# Patient Record
Sex: Male | Born: 1970 | Race: White | Hispanic: No | Marital: Married | Smoking: Never smoker
Health system: Southern US, Community
[De-identification: ages and names within clinical notes are randomized; demographics above are authoritative.]

## PROBLEM LIST (undated history)

## (undated) DIAGNOSIS — R519 Headache, unspecified: Secondary | ICD-10-CM

## (undated) DIAGNOSIS — M5136 Other intervertebral disc degeneration, lumbar region: Secondary | ICD-10-CM

## (undated) DIAGNOSIS — I10 Essential (primary) hypertension: Secondary | ICD-10-CM

## (undated) DIAGNOSIS — M51369 Other intervertebral disc degeneration, lumbar region without mention of lumbar back pain or lower extremity pain: Secondary | ICD-10-CM

## (undated) DIAGNOSIS — R51 Headache: Secondary | ICD-10-CM

## (undated) HISTORY — PX: TENDON REPAIR: SHX5111

## (undated) HISTORY — PX: HIP SURGERY: SHX245

## (undated) HISTORY — PX: VASECTOMY: SHX75

---

## 2005-04-14 ENCOUNTER — Emergency Department: Payer: Self-pay | Admitting: Emergency Medicine

## 2013-02-18 ENCOUNTER — Ambulatory Visit: Payer: Self-pay | Admitting: Family Medicine

## 2013-07-14 ENCOUNTER — Ambulatory Visit: Payer: Self-pay | Admitting: Family Medicine

## 2014-03-19 ENCOUNTER — Ambulatory Visit: Payer: Self-pay | Admitting: Unknown Physician Specialty

## 2014-11-23 ENCOUNTER — Ambulatory Visit: Payer: Self-pay | Admitting: Physician Assistant

## 2015-09-20 ENCOUNTER — Encounter: Payer: Self-pay | Admitting: Anesthesiology

## 2015-09-20 ENCOUNTER — Ambulatory Visit: Payer: 59 | Attending: Anesthesiology | Admitting: Anesthesiology

## 2015-09-20 ENCOUNTER — Other Ambulatory Visit: Payer: Self-pay | Admitting: Anesthesiology

## 2015-09-20 VITALS — BP 140/92 | HR 109 | Temp 98.5°F | Resp 16 | Ht 71.0 in | Wt 172.0 lb

## 2015-09-20 DIAGNOSIS — M5136 Other intervertebral disc degeneration, lumbar region: Secondary | ICD-10-CM | POA: Insufficient documentation

## 2015-09-20 DIAGNOSIS — M79606 Pain in leg, unspecified: Secondary | ICD-10-CM | POA: Diagnosis present

## 2015-09-20 DIAGNOSIS — Z9889 Other specified postprocedural states: Secondary | ICD-10-CM | POA: Insufficient documentation

## 2015-09-20 DIAGNOSIS — M25559 Pain in unspecified hip: Secondary | ICD-10-CM | POA: Diagnosis present

## 2015-09-20 DIAGNOSIS — M5442 Lumbago with sciatica, left side: Secondary | ICD-10-CM | POA: Diagnosis not present

## 2015-09-20 DIAGNOSIS — M5440 Lumbago with sciatica, unspecified side: Secondary | ICD-10-CM | POA: Insufficient documentation

## 2015-09-20 DIAGNOSIS — G8929 Other chronic pain: Secondary | ICD-10-CM | POA: Insufficient documentation

## 2015-09-20 DIAGNOSIS — M461 Sacroiliitis, not elsewhere classified: Secondary | ICD-10-CM

## 2015-09-20 DIAGNOSIS — M549 Dorsalgia, unspecified: Secondary | ICD-10-CM | POA: Diagnosis present

## 2015-09-20 DIAGNOSIS — M25552 Pain in left hip: Secondary | ICD-10-CM | POA: Diagnosis not present

## 2015-09-20 NOTE — Progress Notes (Signed)
Patient here to discuss trigger point injections to help with low back pain that radiates into L hip and L leg.  Reports scheduled surgery on March 15 for a Labrial repair and spur on hip bone which will be done orthoscopically.  Also reports bulging disc between L4 and L5.  Patient uses Tramadol for pain

## 2015-09-21 NOTE — Progress Notes (Signed)
Subjective:  Patient ID: [name] [name], male    DOB: [birth year]  Age: 45 y.o. MRN: [id number]  CC: Back Pain; Hip Pain; and Leg Pain   HPI [name] [name] presents for a new patient evaluation he has a long-standing history of low back pain it's been present for 2-1/2 years. The pain he describes initiated after a fall on rocks. It's gradually gotten worse and he has been evaluated and pain control Center's in the past. He has a maximum VAS score of 6 at best a 3 and averaging about a 4. The pain is not influence by time of day but worse after activity and aggravated by bending sitting standing and walking. TENS application and lying down seem to help his pain relief. He has associated complaints of muscle spasms and numbness and tingling affecting the left greater than right toes with calf cramping on the left side pain that wakes him up at night and this is described as aching deep and disabling. She's had a previous MRI EMG study an orthopedic evaluation. He is also had TENS unit application. At this point he is followed by Dr. Leonette [name] at [facility] for spinal surgery evaluation which was performed back in November 2016. He is also scheduled to have a labral tear in his left hip repaired in March of this year. The previous MRI of his back was performed in October last year with a hip MRI in December of last year. He also reports having had an EMG study that showed to be within normal limits.  History [name] [name] has no past medical history on file.   He has past surgical history that includes Tendon repair (Right) and Vasectomy.   His family history includes Hypertension in his father and mother.He reports that he has never smoked. He does not have any smokeless tobacco history on file. He reports that he does not use illicit drugs. His alcohol history is not on file.   ---------------------------------------------------------------------------------------------------------------------- History  reviewed. No pertinent past medical history.  Past Surgical History  Procedure Laterality Date  . Tendon repair Right     cut tendon repaired in R thumb  . Vasectomy      Family History  Problem Relation Age of Onset  . Hypertension Mother   . Hypertension Father     Social History  Substance Use Topics  . Smoking status: Never Smoker   . Smokeless tobacco: Not on file  . Alcohol Use: Not on file    ---------------------------------------------------------------------------------------------------------------------- Social History   Social History  . Marital Status: Married    Spouse Name: N/A  . Number of Children: N/A  . Years of Education: N/A   Social History Main Topics  . Smoking status: Never Smoker   . Smokeless tobacco: None  . Alcohol Use: None  . Drug Use: No  . Sexual Activity: Not Asked   Other Topics Concern  . None   Social History Narrative  . None      ----------------------------------------------------------------------------------------------------------------------  ROS Review of Systems  Review of vascular for hypertension otherwise negative Pulmonary negative Neurologic negative Psychological negative GI negative   Objective:  BP 140/92 mmHg  Pulse 109  Temp(Src) 98.5 F (36.9 C) (Oral)  Resp 16  Ht  (1.803 m)  Wt 172 lb (78.019 kg)  BMI 24.00 kg/m2  SpO2 100%  Physical Exam  Patient is alert oriented cooperative compliant Pupils equally round reactive to light with extraocular muscles intact Heart is regular rate and rhythm without  murmur or rub Lungs are clear also dictation with distant breath sounds no wheezing or rales Inspection low back reveals some paraspinous muscle tenderness worse on the left side. With the patient in the standing position he does have pain with left lateral rotation and extension at the low back greater than right lateral rotation. In the supine position he has a positive straight leg  raise at 30 left as right leg raise positive at 30 right both maneuvers radiate pain to the feet bilaterally. His strength in the lower extremities somewhat equivocal and I would rate this a 5 minus over 5 to dorsiflexion and plantar flexion at the feet. Sensation is intact to light touch    Assessment & Plan:   [name] [name] was seen today for back pain, hip pain and leg pain.  Diagnoses and all orders for this visit:  DDD (degenerative disc disease), lumbar  Bilateral low back pain with left-sided sciatica  Hip pain, chronic, left     ----------------------------------------------------------------------------------------------------------------------  Problem List Items Addressed This Visit    None    Visit Diagnoses    DDD (degenerative disc disease), lumbar    -  Primary    Relevant Medications    traMADol (ULTRAM) 50 MG tablet    meloxicam (MOBIC) 15 MG tablet    tiZANidine (ZANAFLEX) 4 MG capsule    Bilateral low back pain with left-sided sciatica        Relevant Medications    traMADol (ULTRAM) 50 MG tablet    meloxicam (MOBIC) 15 MG tablet    tiZANidine (ZANAFLEX) 4 MG capsule    Hip pain, chronic, left        Relevant Medications    traMADol (ULTRAM) 50 MG tablet    meloxicam (MOBIC) 15 MG tablet    tiZANidine (ZANAFLEX) 4 MG capsule    traZODone (DESYREL) 50 MG tablet       ----------------------------------------------------------------------------------------------------------------------  1. DDD (degenerative disc disease), lumbar We will plan on an epidural steroid injection which I think will be of benefit for the radiculitis that he's experiencing. This is left side greater than right and appears to be in the L5-S1 distribution. We'll plan on doing this most likely following his labral hip surgery. A repeat evaluation was agreed upon in approximately 3 months on his rehabilitation from this procedure in about 2 months.  2. Bilateral low back pain with  left-sided sciatica Same  3. Hip pain, chronic, left Continue follow-up with his orthopedic surgeon for surgical repair of a labral tear 4. He may be candidate for an SI injection on the left side with symptoms consistent with sacroiliitis and possibly facet genic characteristics for a facet block.    ----------------------------------------------------------------------------------------------------------------------  I am having Mr. Nier maintain his traMADol, amLODipine, lisinopril, meloxicam, alfuzosin, tiZANidine, traZODone, and sildenafil.   Meds ordered this encounter  Medications  . traMADol (ULTRAM) 50 MG tablet    Sig: Take 100 mg by mouth 2 (two) times daily.  Marland Kitchen amLODipine (NORVASC) 5 MG tablet    Sig: Take 5 mg by mouth daily.  Marland Kitchen lisinopril (PRINIVIL,ZESTRIL) 10 MG tablet    Sig: Take 10 mg by mouth daily.  . meloxicam (MOBIC) 15 MG tablet    Sig: Take 15 mg by mouth daily.  Marland Kitchen alfuzosin (UROXATRAL) 10 MG 24 hr tablet    Sig: Take 10 mg by mouth daily with breakfast.  . tiZANidine (ZANAFLEX) 4 MG capsule    Sig: Take 4 mg by mouth 1 day or 1  dose.  . traZODone (DESYREL) 50 MG tablet    Sig: Take 50 mg by mouth at bedtime.  . sildenafil (REVATIO) 20 MG tablet    Sig: Take 20 mg by mouth 1 day or 1 dose.       Follow-up: Return in about 4 months (around 01/18/2016) for procedure.    Yevette Edwards, MD

## 2015-09-29 LAB — TOXASSURE SELECT 13 (MW), URINE: PDF: 0

## 2015-10-14 LAB — SPECIMEN STATUS REPORT

## 2016-07-05 NOTE — Discharge Instructions (Signed)
[facility] °[facility] °ENDOSCOPIC SINUS SURGERY °[facility] ° °What is Functional Endoscopic Sinus Surgery? ° The Surgery involves making the natural openings of the sinuses larger by removing the bony partitions that separate the sinuses from the nasal cavity.  The natural sinus lining is preserved as much as possible to allow the sinuses to resume normal function after the surgery.  In some patients nasal polyps (excessively swollen lining of the sinuses) may be removed to relieve obstruction of the sinus openings.  The surgery is performed through the nose using lighted scopes, which eliminates the need for incisions on the face.  A septoplasty is a different procedure which is sometimes performed with sinus surgery.  It involves straightening the boy partition that separates the two sides of your nose.  A crooked or deviated septum may need repair if is obstructing the sinuses or nasal airflow.  Turbinate reduction is also often performed during sinus surgery.  The turbinates are bony proturberances from the side walls of the nose which swell and can obstruct the nose in patients with sinus and allergy problems.  Their size can be surgically reduced to help relieve nasal obstruction. ° °What Can Sinus Surgery Do For Me? ° Sinus surgery can reduce the frequency of sinus infections requiring antibiotic treatment.  This can provide improvement in nasal congestion, post-nasal drainage, facial pressure and nasal obstruction.  Surgery will NOT prevent you from ever having an infection again, so it usually only for patients who get infections 4 or more times yearly requiring antibiotics, or for infections that do not clear with antibiotics.  It will not cure nasal allergies, so patients with allergies may still require medication to treat their allergies after surgery. Surgery may improve headaches related to sinusitis, however, some people will continue to  require medication to control sinus headaches related to allergies.  Surgery will do nothing for other forms of headache (migraine, tension or cluster). ° °What Are the Risks of Endoscopic Sinus Surgery? ° Current techniques allow surgery to be performed safely with little risk, however, there are rare complications that patients should be aware of.  Because the sinuses are located around the eyes, there is risk of eye injury, including blindness, though again, this would be quite rare. This is usually a result of bleeding behind the eye during surgery, which puts the vision oat risk, though there are treatments to protect the vision and prevent permanent disrupted by surgery causing a leak of the spinal fluid that surrounds the brain.  More serious complications would include bleeding inside the brain cavity or damage to the brain.  Again, all of these complications are uncommon, and spinal fluid leaks can be safely managed surgically if they occur.  The most common complication of sinus surgery is bleeding from the nose, which may require packing or cauterization of the nose.  Continued sinus have polyps may experience recurrence of the polyps requiring revision surgery.  Alterations of sense of smell or injury to the tear ducts are also rare complications.  ° °What is the Surgery Like, and what is the Recovery? ° The Surgery usually takes a couple of hours to perform, and is usually performed under a general anesthetic (completely asleep).  Patients are usually discharged home after a couple of hours.  Sometimes during surgery it is necessary to pack the nose to control bleeding, and the packing is left in place for 24 - 48 hours, and removed by your surgeon.    If a septoplasty was performed during the procedure, there is often a splint placed which must be removed after 5-7 days.   °Discomfort: Pain is usually mild to moderate, and can be controlled by prescription pain medication or acetaminophen (Tylenol).   Aspirin, Ibuprofen (Advil, Motrin), or Naprosyn (Aleve) should be avoided, as they can cause increased bleeding.  Most patients feel sinus pressure like they have a bad head cold for several days.  Sleeping with your head elevated can help reduce swelling and facial pressure, as can ice packs over the face.  A humidifier may be helpful to keep the mucous and blood from drying in the nose.  ° °Diet: There are no specific diet restrictions, however, you should generally start with clear liquids and a light diet of bland foods because the anesthetic can cause some nausea.  Advance your diet depending on how your stomach feels.  Taking your pain medication with food will often help reduce stomach upset which pain medications can cause. ° °Nasal Saline Irrigation: It is important to remove blood clots and dried mucous from the nose as it is healing.  This is done by having you irrigate the nose at least 3 - 4 times daily with a salt water solution.  We recommend using NeilMed Sinus Rinse (available at the drug store).  Fill the squeeze bottle with the solution, bend over a sink, and insert the tip of the squeeze bottle into the nose ½ of an inch.  Point the tip of the squeeze bottle towards the inside corner of the eye on the same side your irrigating.  Squeeze the bottle and gently irrigate the nose.  If you bend forward as you do this, most of the fluid will flow back out of the nose, instead of down your throat.   The solution should be warm, near body temperature, when you irrigate.   Each time you irrigate, you should use a full squeeze bottle.  ° °Note that if you are instructed to use Nasal Steroid Sprays at any time after your surgery, irrigate with saline BEFORE using the steroid spray, so you do not wash it all out of the nose. °Another product, Nasal Saline Gel (such as AYR Nasal Saline Gel) can be applied in each nostril 3 - 4 times daily to moisture the nose and reduce scabbing or crusting. ° °Bleeding:   Bloody drainage from the nose can be expected for several days, and patients are instructed to irrigate their nose frequently with salt water to help remove mucous and blood clots.  The drainage may be dark red or brown, though some fresh blood may be seen intermittently, especially after irrigation.  Do not blow you nose, as bleeding may occur. If you must sneeze, keep your mouth open to allow air to escape through your mouth. ° °If heavy bleeding occurs: Irrigate the nose with saline to rinse out clots, then spray the nose 3 - 4 times with Afrin Nasal Decongestant Spray.  The spray will constrict the blood vessels to slow bleeding.  Pinch the lower half of your nose shut to apply pressure, and lay down with your head elevated.  Ice packs over the nose may help as well. If bleeding persists despite these measures, you should notify your doctor.  Do not use the Afrin routinely to control nasal congestion after surgery, as it can result in worsening congestion and may affect healing.  ° ° ° °Activity: Return to work varies among patients. Most patients will be   out of work at least 5 - 7 days to recover.  Patient may return to work after they are off of narcotic pain medication, and feeling well enough to perform the functions of their job.  Patients must avoid heavy lifting (over 10 pounds) or strenuous physical for 2 weeks after surgery, so your employer may need to assign you to light duty, or keep you out of work longer if light duty is not possible.  NOTE: you should not drive, operate dangerous machinery, do any mentally demanding tasks or make any important legal or financial decisions while on narcotic pain medication and recovering from the general anesthetic.  °  °Call Your Doctor Immediately if You Have Any of the Following: °1. Bleeding that you cannot control with the above measures °2. Loss of vision, double vision, bulging of the eye or black eyes. °3. Fever over 101 degrees °4. Neck stiffness with  severe headache, fever, nausea and change in mental state. °You are always encourage to call anytime with concerns, however, please call with requests for pain medication refills during office hours. ° °Office Endoscopy: During follow-up visits your doctor will remove any packing or splints that may have been placed and evaluate and clean your sinuses endoscopically.  Topical anesthetic will be used to make this as comfortable as possible, though you may want to take your pain medication prior to the visit.  How often this will need to be done varies from patient to patient.  After complete recovery from the surgery, you may need follow-up endoscopy from time to time, particularly if there is concern of recurrent infection or nasal polyps. ° °General Anesthesia, Adult, Care After °Refer to this sheet in the next few weeks. These instructions provide you with information on caring for yourself after your procedure. Your health care provider may also give you more specific instructions. Your treatment has been planned according to current medical practices, but problems sometimes occur. Call your health care provider if you have any problems or questions after your procedure. °WHAT TO EXPECT AFTER THE PROCEDURE °After the procedure, it is typical to experience: °· Sleepiness. °· Nausea and vomiting. °HOME CARE INSTRUCTIONS °· For the first 24 hours after general anesthesia: °¨ Have a responsible person with you. °¨ Do not drive a car. If you are alone, do not take public transportation. °¨ Do not drink alcohol. °¨ Do not take medicine that has not been prescribed by your health care provider. °¨ Do not sign important papers or make important decisions. °¨ You may resume a normal diet and activities as directed by your health care provider. °· Change bandages (dressings) as directed. °· If you have questions or problems that seem related to general anesthesia, call the hospital and ask for the anesthetist or  anesthesiologist on call. °SEEK MEDICAL CARE IF: °· You have nausea and vomiting that continue the day after anesthesia. °· You develop a rash. °SEEK IMMEDIATE MEDICAL CARE IF:  °· You have difficulty breathing. °· You have chest pain. °· You have any allergic problems. °  °This information is not intended to replace advice given to you by your health care provider. Make sure you discuss any questions you have with your health care provider. °  °Document Released: 11/26/2000 Document Revised: 09/10/2014 Document Reviewed: 12/19/2011 °Elsevier Interactive Patient Education ©2016 [employer] ° °

## 2016-07-12 ENCOUNTER — Ambulatory Visit: Payer: 59 | Admitting: Anesthesiology

## 2016-07-12 ENCOUNTER — Ambulatory Visit
Admission: RE | Admit: 2016-07-12 | Discharge: 2016-07-12 | Disposition: A | Discharge status: Home / Self Care | Payer: 59 | Source: Ambulatory Visit | Attending: Otolaryngology | Admitting: Otolaryngology

## 2016-07-12 ENCOUNTER — Encounter
Admission: RE | Disposition: A | Discharge status: Home / Self Care | Payer: Self-pay | Source: Ambulatory Visit | Attending: Otolaryngology

## 2016-07-12 ENCOUNTER — Ambulatory Visit: Admit: 2016-07-12 | Payer: 59 | Admitting: Otolaryngology

## 2016-07-12 ENCOUNTER — Observation Stay
Admission: AD | Admit: 2016-07-12 | Discharge: 2016-07-13 | Disposition: A | Discharge status: Home / Self Care | Payer: 59 | Source: Ambulatory Visit | Attending: Otolaryngology | Admitting: Otolaryngology

## 2016-07-12 ENCOUNTER — Inpatient Hospital Stay: Admission: AD | Admit: 2016-07-12 | Payer: Self-pay | Source: Intra-hospital | Admitting: Psychiatry

## 2016-07-12 DIAGNOSIS — I10 Essential (primary) hypertension: Secondary | ICD-10-CM | POA: Insufficient documentation

## 2016-07-12 DIAGNOSIS — J343 Hypertrophy of nasal turbinates: Secondary | ICD-10-CM | POA: Diagnosis not present

## 2016-07-12 DIAGNOSIS — Z91018 Allergy to other foods: Secondary | ICD-10-CM | POA: Diagnosis not present

## 2016-07-12 DIAGNOSIS — Z9889 Other specified postprocedural states: Secondary | ICD-10-CM | POA: Insufficient documentation

## 2016-07-12 DIAGNOSIS — R040 Epistaxis: Secondary | ICD-10-CM | POA: Diagnosis not present

## 2016-07-12 DIAGNOSIS — J342 Deviated nasal septum: Secondary | ICD-10-CM | POA: Diagnosis not present

## 2016-07-12 DIAGNOSIS — M1990 Unspecified osteoarthritis, unspecified site: Secondary | ICD-10-CM | POA: Insufficient documentation

## 2016-07-12 HISTORY — DX: Other intervertebral disc degeneration, lumbar region: M51.36

## 2016-07-12 HISTORY — DX: Essential (primary) hypertension: I10

## 2016-07-12 HISTORY — PX: TURBINATE REDUCTION: SHX6157

## 2016-07-12 HISTORY — PX: ENDOSCOPIC CONCHA BULLOSA RESECTION: SHX6395

## 2016-07-12 HISTORY — PX: NASAL ENDOSCOPY WITH EPISTAXIS CONTROL: SHX5664

## 2016-07-12 HISTORY — DX: Headache: R51

## 2016-07-12 HISTORY — DX: Other intervertebral disc degeneration, lumbar region without mention of lumbar back pain or lower extremity pain: M51.369

## 2016-07-12 HISTORY — DX: Headache, unspecified: R51.9

## 2016-07-12 HISTORY — PX: SEPTOPLASTY: SHX2393

## 2016-07-12 SURGERY — SEPTOPLASTY, NOSE
Anesthesia: General | Laterality: Right | Wound class: Clean Contaminated

## 2016-07-12 SURGERY — CONTROL OF EPISTAXIS, ENDOSCOPIC
Anesthesia: General | Laterality: Bilateral | Wound class: Clean Contaminated

## 2016-07-12 MED ORDER — OXYCODONE HCL 5 MG PO TABS: 5.0000 mg | ORAL_TABLET | Freq: Once | ORAL | Status: DC

## 2016-07-12 MED ORDER — HYDROMORPHONE HCL 1 MG/ML IJ SOLN
1.0000 mg | INTRAMUSCULAR | Status: DC | PRN
  Administered 2016-07-13: 1 mg via INTRAVENOUS
  Filled 2016-07-12: qty 1

## 2016-07-12 MED ORDER — OXYMETAZOLINE HCL 0.05 % NA SOLN
2.0000 | Freq: Once | NASAL | Status: AC
  Administered 2016-07-12: 2 via NASAL

## 2016-07-12 MED ORDER — ONDANSETRON HCL 4 MG/2ML IJ SOLN: 4.0000 mg | Freq: Four times a day (QID) | INTRAMUSCULAR | Status: DC | PRN

## 2016-07-12 MED ORDER — TRAZODONE HCL 50 MG PO TABS
50.0000 mg | ORAL_TABLET | Freq: Every day | ORAL | Status: DC
  Administered 2016-07-12: 50 mg via ORAL
  Filled 2016-07-12: qty 1

## 2016-07-12 MED ORDER — LIDOCAINE HCL 1 % IJ SOLN: INTRAMUSCULAR | Status: DC | PRN

## 2016-07-12 MED ORDER — LACTATED RINGERS IV SOLN
INTRAVENOUS | Status: DC | PRN
  Administered 2016-07-12: 16:00:00 via INTRAVENOUS

## 2016-07-12 MED ORDER — DEXTROSE-NACL 5-0.45 % IV SOLN
INTRAVENOUS | Status: DC
  Administered 2016-07-12 – 2016-07-13 (×2): via INTRAVENOUS

## 2016-07-12 MED ORDER — DEXAMETHASONE SODIUM PHOSPHATE 4 MG/ML IJ SOLN
INTRAMUSCULAR | Status: DC | PRN
  Administered 2016-07-12: 10 mg via INTRAVENOUS

## 2016-07-12 MED ORDER — DEXTROSE-NACL 5-0.45 % IV SOLN: INTRAVENOUS | Status: DC

## 2016-07-12 MED ORDER — HYDRALAZINE HCL 20 MG/ML IJ SOLN
10.0000 mg | Freq: Once | INTRAMUSCULAR | Status: AC
  Administered 2016-07-12: 10 mg via INTRAVENOUS

## 2016-07-12 MED ORDER — SUCCINYLCHOLINE CHLORIDE 20 MG/ML IJ SOLN
INTRAMUSCULAR | Status: DC | PRN
  Administered 2016-07-12: 100 mg via INTRAVENOUS

## 2016-07-12 MED ORDER — PROMETHAZINE HCL 25 MG/ML IJ SOLN: 6.2500 mg | INTRAMUSCULAR | Status: DC | PRN

## 2016-07-12 MED ORDER — PHENYLEPHRINE HCL 0.5 % NA SOLN
NASAL | Status: DC | PRN
  Administered 2016-07-12: 30 mL via TOPICAL

## 2016-07-12 MED ORDER — DEXTROSE 5 % IV SOLN
2000.0000 mg | Freq: Once | INTRAVENOUS | Status: AC
  Administered 2016-07-12: 2000 mg via INTRAVENOUS

## 2016-07-12 MED ORDER — ONDANSETRON HCL 4 MG/2ML IJ SOLN
INTRAMUSCULAR | Status: DC | PRN
  Administered 2016-07-12: 4 mg via INTRAVENOUS

## 2016-07-12 MED ORDER — PROPOFOL 10 MG/ML IV BOLUS
INTRAVENOUS | Status: DC | PRN
  Administered 2016-07-12: 150 mg via INTRAVENOUS

## 2016-07-12 MED ORDER — LISINOPRIL 10 MG PO TABS
10.0000 mg | ORAL_TABLET | Freq: Every day | ORAL | Status: DC
  Administered 2016-07-13: 10 mg via ORAL
  Filled 2016-07-12 (×2): qty 1

## 2016-07-12 MED ORDER — ACETAMINOPHEN 325 MG PO TABS: 650.0000 mg | ORAL_TABLET | Freq: Four times a day (QID) | ORAL | Status: DC | PRN

## 2016-07-12 MED ORDER — BACITRACIN ZINC 500 UNIT/GM EX OINT
TOPICAL_OINTMENT | CUTANEOUS | Status: DC | PRN
  Administered 2016-07-12: 2 via TOPICAL

## 2016-07-12 MED ORDER — FENTANYL CITRATE (PF) 100 MCG/2ML IJ SOLN
INTRAMUSCULAR | Status: DC | PRN
  Administered 2016-07-12: 100 ug via INTRAVENOUS

## 2016-07-12 MED ORDER — OXYCODONE HCL 5 MG/5ML PO SOLN: 5.0000 mg | Freq: Once | ORAL | Status: AC | PRN

## 2016-07-12 MED ORDER — OXYCODONE HCL 5 MG PO TABS
5.0000 mg | ORAL_TABLET | Freq: Once | ORAL | Status: AC | PRN
  Administered 2016-07-12: 5 mg via ORAL

## 2016-07-12 MED ORDER — ALFUZOSIN HCL ER 10 MG PO TB24
10.0000 mg | ORAL_TABLET | Freq: Every day | ORAL | Status: DC
  Administered 2016-07-12: 10 mg via ORAL
  Filled 2016-07-12 (×2): qty 1

## 2016-07-12 MED ORDER — SCOPOLAMINE 1 MG/3DAYS TD PT72
1.0000 | MEDICATED_PATCH | TRANSDERMAL | Status: DC
  Administered 2016-07-12: 1.5 mg via TRANSDERMAL

## 2016-07-12 MED ORDER — LACTATED RINGERS IV SOLN
INTRAVENOUS | Status: DC
  Administered 2016-07-12: 12:00:00 via INTRAVENOUS

## 2016-07-12 MED ORDER — FLEET ENEMA 7-19 GM/118ML RE ENEM: 1.0000 | ENEMA | Freq: Once | RECTAL | Status: DC | PRN

## 2016-07-12 MED ORDER — ACETAMINOPHEN 325 MG PO TABS
975.0000 mg | ORAL_TABLET | Freq: Once | ORAL | Status: AC
  Administered 2016-07-12: 975 mg via ORAL

## 2016-07-12 MED ORDER — LIDOCAINE HCL (CARDIAC) 20 MG/ML IV SOLN
INTRAVENOUS | Status: DC | PRN
  Administered 2016-07-12: 50 mg via INTRAVENOUS

## 2016-07-12 MED ORDER — BISACODYL 5 MG PO TBEC: 5.0000 mg | DELAYED_RELEASE_TABLET | Freq: Every day | ORAL | Status: DC | PRN

## 2016-07-12 MED ORDER — AMLODIPINE BESYLATE 5 MG PO TABS
5.0000 mg | ORAL_TABLET | Freq: Every day | ORAL | Status: DC
  Administered 2016-07-13: 5 mg via ORAL
  Filled 2016-07-12 (×2): qty 1

## 2016-07-12 MED ORDER — MEPERIDINE HCL 25 MG/ML IJ SOLN: 6.2500 mg | INTRAMUSCULAR | Status: DC | PRN

## 2016-07-12 MED ORDER — ACETAMINOPHEN 650 MG RE SUPP: 650.0000 mg | Freq: Four times a day (QID) | RECTAL | Status: DC | PRN

## 2016-07-12 MED ORDER — LIDOCAINE-EPINEPHRINE 1 %-1:100000 IJ SOLN
INTRAMUSCULAR | Status: DC | PRN
  Administered 2016-07-12: 8 mL

## 2016-07-12 MED ORDER — OXYMETAZOLINE HCL 0.05 % NA SOLN
2.0000 | NASAL | Status: AC | PRN
  Administered 2016-07-12 (×2): 2 via NASAL

## 2016-07-12 MED ORDER — ONDANSETRON 4 MG PO TBDP: 4.0000 mg | ORAL_TABLET | Freq: Four times a day (QID) | ORAL | Status: DC | PRN

## 2016-07-12 MED ORDER — MIDAZOLAM HCL 5 MG/5ML IJ SOLN
INTRAMUSCULAR | Status: DC | PRN
  Administered 2016-07-12: 2 mg via INTRAVENOUS

## 2016-07-12 MED ORDER — MAGNESIUM HYDROXIDE 400 MG/5ML PO SUSP: 30.0000 mL | Freq: Every day | ORAL | Status: DC | PRN

## 2016-07-12 MED ORDER — FENTANYL CITRATE (PF) 100 MCG/2ML IJ SOLN
25.0000 ug | INTRAMUSCULAR | Status: DC | PRN
  Administered 2016-07-12: 50 ug via INTRAVENOUS
  Administered 2016-07-12 (×2): 25 ug via INTRAVENOUS

## 2016-07-12 MED ORDER — SILDENAFIL CITRATE 20 MG PO TABS
20.0000 mg | ORAL_TABLET | Freq: Every day | ORAL | Status: DC
  Filled 2016-07-12 (×2): qty 1

## 2016-07-12 MED ORDER — HYDROCODONE-ACETAMINOPHEN 5-325 MG PO TABS
1.0000 | ORAL_TABLET | ORAL | Status: DC | PRN
  Administered 2016-07-12 – 2016-07-13 (×3): 1 via ORAL
  Administered 2016-07-13: 2 via ORAL
  Filled 2016-07-12 (×2): qty 1
  Filled 2016-07-12: qty 2
  Filled 2016-07-12: qty 1

## 2016-07-12 MED ORDER — METOPROLOL TARTRATE 5 MG/5ML IV SOLN
5.0000 mg | Freq: Once | INTRAVENOUS | Status: AC
  Administered 2016-07-12: 5 mg via INTRAVENOUS

## 2016-07-12 MED ORDER — ACETAMINOPHEN 10 MG/ML IV SOLN
1000.0000 mg | Freq: Once | INTRAVENOUS | Status: AC
  Administered 2016-07-12: 1000 mg via INTRAVENOUS

## 2016-07-12 MED ORDER — HYDROMORPHONE HCL 1 MG/ML IJ SOLN: 0.2500 mg | INTRAMUSCULAR | Status: DC | PRN

## 2016-07-12 MED ORDER — PROPOFOL 10 MG/ML IV BOLUS
INTRAVENOUS | Status: DC | PRN
  Administered 2016-07-12: 200 mg via INTRAVENOUS

## 2016-07-12 MED ORDER — HYDROMORPHONE HCL 1 MG/ML IJ SOLN
0.2500 mg | INTRAMUSCULAR | Status: DC | PRN
  Administered 2016-07-12: 0.5 mg via INTRAVENOUS
  Administered 2016-07-12: 0.4 mg via INTRAVENOUS
  Administered 2016-07-12: 0.5 mg via INTRAVENOUS
  Administered 2016-07-12: 0.4 mg via INTRAVENOUS
  Administered 2016-07-12: 0.2 mg via INTRAVENOUS

## 2016-07-12 SURGICAL SUPPLY — 33 items
BALLOON SINUPLASTY SYSTEM (BALLOONS) IMPLANT
CANISTER SUCT 1200ML W/VALVE (MISCELLANEOUS) ×5 IMPLANT
CATH IV 18X1 1/4 SAFELET (CATHETERS) ×5 IMPLANT
COAGULATOR SUCT 8FR VV (MISCELLANEOUS) ×5 IMPLANT
DEVICE INFLATION SEID (MISCELLANEOUS) IMPLANT
DRAPE HEAD BAR (DRAPES) ×5 IMPLANT
GLOVE PI ULTRA LF STRL 7.5 (GLOVE) ×6 IMPLANT
GLOVE PI ULTRA NON LATEX 7.5 (GLOVE) ×4
IV CATH 18X1 1/4 SAFELET (CATHETERS) ×3
IV NS 500ML (IV SOLUTION) ×2
IV NS 500ML BAXH (IV SOLUTION) ×3 IMPLANT
KIT ROOM TURNOVER OR (KITS) ×5 IMPLANT
NEEDLE ANESTHESIA  27G X 3.5 (NEEDLE) ×2
NEEDLE ANESTHESIA 27G X 3.5 (NEEDLE) ×3 IMPLANT
NS IRRIG 500ML POUR BTL (IV SOLUTION) ×5 IMPLANT
PACK DRAPE NASAL/ENT (PACKS) ×5 IMPLANT
PACKING NASAL EPIS 4X2.4 XEROG (MISCELLANEOUS) ×5 IMPLANT
PAD GROUND ADULT SPLIT (MISCELLANEOUS) ×5 IMPLANT
PATTIES SURGICAL .5 X3 (DISPOSABLE) ×5 IMPLANT
SHAVER DIEGO BLD STD TYPE A (BLADE) IMPLANT
SOL ANTI-FOG 6CC FOG-OUT (MISCELLANEOUS) ×3 IMPLANT
SOL FOG-OUT ANTI-FOG 6CC (MISCELLANEOUS) ×2
SPLINT NASAL SEPTAL BLV .50 ST (MISCELLANEOUS) ×5 IMPLANT
STRAP BODY AND KNEE 60X3 (MISCELLANEOUS) ×10 IMPLANT
SUT CHROMIC 3-0 (SUTURE)
SUT CHROMIC 3-0 KS 27XMFL CR (SUTURE)
SUT ETHILON 3-0 KS 30 BLK (SUTURE) ×5 IMPLANT
SUT PLAIN GUT 4-0 (SUTURE) ×5 IMPLANT
SUTURE CHRMC 3-0 KS 27XMFL CR (SUTURE) IMPLANT
SYR 3ML LL SCALE MARK (SYRINGE) ×5 IMPLANT
TOWEL OR 17X26 4PK STRL BLUE (TOWEL DISPOSABLE) ×5 IMPLANT
TUBING DECLOG (TUBING) IMPLANT
WATER STERILE IRR 250ML POUR (IV SOLUTION) IMPLANT

## 2016-07-12 SURGICAL SUPPLY — 19 items
BASIN GRAD PLASTIC 32OZ STRL (MISCELLANEOUS) IMPLANT
CANISTER SUCT 1200ML W/VALVE (MISCELLANEOUS) ×3 IMPLANT
COAG SUCT 10F 3.5MM HAND CTRL (MISCELLANEOUS) ×3 IMPLANT
COVER TABLE BACK 60X90 (DRAPES) ×3 IMPLANT
CUP MEDICINE 2OZ PLAST GRAD ST (MISCELLANEOUS) IMPLANT
GAUZE SPONGE 4X4 12PLY STRL (GAUZE/BANDAGES/DRESSINGS) ×6 IMPLANT
GLOVE PI ULTRA LF STRL 7.5 (GLOVE) ×1 IMPLANT
GLOVE PI ULTRA NON LATEX 7.5 (GLOVE) ×2
KIT ROOM TURNOVER OR (KITS) ×3 IMPLANT
NEEDLE HYPO 25GX1X1/2 BEV (NEEDLE) ×3 IMPLANT
PACK DRAPE NASAL/ENT (PACKS) ×3 IMPLANT
PACKING EPISTAXIS NASAL BLU LR (MISCELLANEOUS) ×15 IMPLANT
PACKING NASAL EPIS 4X2.4 XEROG (MISCELLANEOUS) ×3 IMPLANT
PAD GROUND ADULT SPLIT (MISCELLANEOUS) ×3 IMPLANT
PATTIES SURGICAL .5 X3 (DISPOSABLE) ×3 IMPLANT
SYRINGE 10CC LL (SYRINGE) ×3 IMPLANT
TUBING CONNECTING 10 (TUBING) ×2 IMPLANT
TUBING CONNECTING 10' (TUBING) ×1
WAND COBLATOR ENT REFLEX ULT45 (MISCELLANEOUS) IMPLANT

## 2016-07-12 NOTE — Anesthesia Postprocedure Evaluation (Signed)
Anesthesia Post Note  Patient: [name] [name]  Procedure(s) Performed: Procedure(s) (LRB): NASAL ENDOSCOPY WITH EPISTAXIS CONTROL (Bilateral)  Patient location during evaluation: PACU Anesthesia Type: General Level of consciousness: awake and alert and oriented Pain management: pain level controlled Vital Signs Assessment: post-procedure vital signs reviewed and stable Respiratory status: spontaneous breathing and nonlabored ventilation Cardiovascular status: stable Postop Assessment: no signs of nausea or vomiting and adequate PO intake Anesthetic complications: no    Harolyn RutherfordJoshua Treyton Slimp

## 2016-07-12 NOTE — Progress Notes (Addendum)
1440 Called & informed Dr. Elenore RotaJuengel that both nares began bleeding again, received T.O. for afrin 2 sprays each nare x 2 doses.   1500 Both nares continue to bleed despite 2 doses of afrin, Dr. Elenore RotaJuengel called & informed, MD verbalizes he will come to bedside & see patient.

## 2016-07-12 NOTE — Op Note (Signed)
07/12/2016  5:53 PM    [name] [name], [name] [name]  [id number]   Pre-Op Dx:  Uncontrolled epistaxis postop septal and turbinate surgery  Post-op Dx: Uncontrolled epistaxis, postop septal and turbinate surgery, apparently from left anterior ethmoid  Proc: Endoscopic evaluation of the nose, bilateral posterior nasal packing   Surg:  Serge Main H  Anes:  GOT  EBL:  300 mL  Comp:  Bleeding postoperative septal and turbinate surgery  Findings:  The bleeding apparently was coming from the left anterior ethmoid artery between the septum and left middle turbinate. There is no bleeding on the right side.  Procedure: The patient was brought to the operating suite and was given general anesthesia again through oral endotracheal intubation. He was bleeding actively from his left side. His blood pressure still was elevated but eventually after anesthesia it seemed like it settle down. He had packing placed in his nose both sides in the recover room but continued to bleed through the packing on the left side. The pressure was running 160-170/90-100 in recovery. He did not seem to respond to indications to control blood pressure, and after about an hour bleeding on and off was felt he needed to go back to the operating room to be reevaluated.  Once patient was asleep his blood pressure was lower and there is no active bleeding vacancy at the time I felt he needed to have this evaluated further. I remove the left nasal spine to the left right one and. Septum was Stilson the midline with the nasal splints in good position. There is some bleeding that was pooling in the back of the nose and nose Suctioning about see where the bleeding was coming from seem like he had some ooze coming from the septum and a little bit coming from along the inferior turbinate but there is no significant bleeding there all this time. After watched for a while I decided to replace the sponge in the left side. I placed a posterior pack in the  left side and weighted. For the first 2 minutes there is no bleeding whatsoever then it welled up through the packing and just started running again fresh red blood. Remove the packing on both sides the nose to reevaluate the area.  There is no bleeding on the right side at all. The xerogel that was overlying the right middle turbinate was still intact and there were no clots anywhere on the right side. Nasal splints were still in position. The side showed little bit of bleeding coming from the septum but I could see blood pooling in the back of the nose and finally track this it was coming medial to the middle turbinate and seen be coming from the anterior ethmoid artery. Then took some xerogel and stuffed it up medial to the middle turbinate and packed this area. Once I packed it in the area of the leading seemed to stop and it was no longer welling up in the posterior nasopharynx. I watch this for good 5 minutes make sure there is no further bleeding or bleeding coming from any other sites. After about 10 minutes decided to go ahead and replace the posterior packs in both sides to help keep pressure on the septum and in these areas. I placed bilateral posterior packs using sponge Prax on both sides to the full length of the nose into the nasopharynx. These went in without difficulty and were soaked with some phenylephrine. Further bleeding for through the sponges and sponges did not have any blood  staining. Further bleeding coming down the posterior pharynx.  A nasogastric tube was then placed in a somewhat to suction out stomach. Old blood was suctioned out and this amounted to approximately 350 mL of old blood and gastric contents. There still was no further evidence of bleeding coming from the nose.  The patient was awakened and taken to the recovery room in satisfactory condition. He was watch there and there is no bleeding coming from the nose. The sponges and the nose had no blood staining. His  posterior pharynx was cleared he was not swallowing any blood.    Dispo:   Patient will be discharged from surgery Center but taken by ambulance to [facility] to be observed overnight because of the amount of blood loss he had. Name to stay at rest and deep his blood pressure under good control to make sure he does not have further bleeding.  Plan:  If his nasal packing remains dry we'll plan to discharge him home tomorrow at noon time area did leave the packing in through the weekend and plan to remove this on Monday.  Mylan Schwarz H  07/12/2016 5:53 PM

## 2016-07-12 NOTE — Addendum Note (Signed)
Addendum  created 07/12/16 1520 by Harolyn RutherfordJoshua Kaede Clendenen, DO   Order list changed, Order sets accessed

## 2016-07-12 NOTE — Addendum Note (Signed)
Addendum  created 07/12/16 1558 by Harolyn RutherfordJoshua Zeplin Aleshire, DO   Anesthesia Event edited

## 2016-07-12 NOTE — Transfer of Care (Signed)
Immediate Anesthesia Transfer of Care Note  Patient: [name] [name]  Procedure(s) Performed: Procedure(s): SEPTOPLASTY (N/A) INFERIOR TURBINATE REDUCTION (Left) ENDOSCOPIC CONCHA BULLOSA RESECTION (Right)  Patient Location: PACU  Anesthesia Type: General  Level of Consciousness: awake, alert  and patient cooperative  Airway and Oxygen Therapy: Patient Spontanous Breathing and Patient connected to supplemental oxygen  Post-op Assessment: Post-op Vital signs reviewed, Patient's Cardiovascular Status Stable, Respiratory Function Stable, Patent Airway and No signs of Nausea or vomiting  Post-op Vital Signs: Reviewed and stable  Complications: No apparent anesthesia complications

## 2016-07-12 NOTE — Anesthesia Postprocedure Evaluation (Signed)
Anesthesia Post Note  Patient: Rocio Shawn Web  Procedure(s) Performed: Procedure(s) (LRB): SEPTOPLASTY (N/A) INFERIOR TURBINATE REDUCTION (Left) ENDOSCOPIC CONCHA BULLOSA RESECTION (Right)  Patient location during evaluation: PACU Anesthesia Type: General Level of consciousness: awake and alert and oriented Pain management: pain level controlled Vital Signs Assessment: post-procedure vital signs reviewed and stable Respiratory status: spontaneous breathing and nonlabored ventilation Cardiovascular status: stable Postop Assessment: no signs of nausea or vomiting and adequate PO intake Anesthetic complications: no    Harolyn RutherfordJoshua Dontez Hauss

## 2016-07-12 NOTE — Progress Notes (Signed)
Report called to Leotis ShamesLauren, 2C RN at [facility]. Pt remains stable at this time. No active bleeding & VSS. Will continue to monitor.

## 2016-07-12 NOTE — Addendum Note (Signed)
Addendum  created 07/12/16 1754 by Harolyn RutherfordJoshua Donelle Hise, DO   Order list changed

## 2016-07-12 NOTE — Op Note (Signed)
07/12/2016  2:08 PM  [id number]   Pre-Op Dx:  Deviated Nasal Septum, Hypertrophic left Inferior Turbinate, conchal bullosa right middle turbinate  Post-op Dx: Same  Proc: Nasal Septoplasty, endoscopic reduction of right middle turbinate conchal bullosa, Partial Reduction left Inferior Turbinate   Surg:  Anieya Helman H  Anes:  GOT  EBL:  50 mL  Comp:  None  Findings: Very large spur inferiorly on the right side pushing into the right inferior turbinate. This was from the maxillary crest and vomer. The left inferior turbinate was overgrown the right middle turbinate had a large conchal bullosa.  Procedure: With the patient in a comfortable supine position,  general orotracheal anesthesia was induced without difficulty.     The patient received preoperative Afrin spray for topical decongestion and vasoconstriction.  Intravenous prophylactic antibiotics were administered.  At an appropriate level, the patient was placed in a semi-sitting position.  Nasal vibrissae were trimmed.   1% Xylocaine with 1:100,000 epinephrine, 8 cc's, was infiltrated into the anterior floor of the nose, into the nasal spine region, into the membranous columella, and finally into the submucoperichondrial plane of the septum on both sides.  Several minutes were allowed for this to take effect.  Cottoniod pledgetts soaked in Afrin and 4% Xylocaine were placed into both nasal cavities and left while the patient was prepped and draped in the standard fashion.  The materials were removed from the nose and observed to be intact and correct in number.  The nose was inspected with a headlight and a 0 scope with the findings as described above.  A left Killian incision was sharply executed and carried down to the quadrangular cartilage. The mucoperichondrium was elelvated along the quadrangular plate back to the bony-cartilaginous junction. The mucoperiostium was then elevated along the ethmoid plate and the vomer. The  boney-catilaginous junction was then split with a freer elevator and the mucoperiosteum was elevated on the opposite side. The mucoperiosteum was then elevated along the maxillary crest as needed to expose the crooked bone of the crest.  Boney spurs of the vomer and maxillary crest were removed with Lenoria Chimeakahashi forceps. I had to use a chisel to free up the maxillary crest the right anterior that was buckled into the right nasal passage.  The cartilaginous plate was trimmed along its posterior and inferior borders of about 2 mm of cartilage to free it up inferiorly. Some of the deviated ethmoid plate was then fractured and removed with Takahashi forceps to free up the posterior border of the quadrangular plate and allow it to swing back to the midline. The mucosal flaps were placed back into their anatomic position to allow visualization of the airways. The septum now sat in the midline with an improved airway. There was a tear of the mucosa inferiorly where the large spur and stuck out on the right side. This was repaired while suturing the septum.  A 3-0 Chromic suture on a Keith needle in used to anchor the inferior septum at the nasal spine with a through and through suture. The mucosal flaps are then sutured together using a through and through whip stitch of 4-0 Plain Gut with a mini-Keith needle. This was used to close the Run[place]illian incision as well.   The inferior turbinates were then inspected. An incision was created along the inferior aspect of the left inferior turbinate with removal of some of the inferior soft tissue and bone. Electrocautery was used to control bleeding in the area. The remaining turbinate was then  outfractured to open up the airway further. There was no significant bleeding noted. The right turbinate was indented from the previous spur. No trimming was necessary here.  The 0 scope was used to visualize the middle turbinate. The right side was enlarged and had a conchal bullosa  filling most of the middle turbinate. An incision was created along its anterior and inferior border to open up the conchal bullosa. The lateral wall this was removed and left open now to the airway. There was more room in the middle meatus. Electrocautery was used along its cut edge to control bleeding. Xerogel was then placed along the trimmed edge and in the middle meatus.  The airways were then visualized and showed open passageways on both sides that were significantly improved compared to before surgery. There was no signifcant bleeding. Nasal splints were applied to both sides of the septum using Xomed 0.335mm regular sized splints that were trimmed, and then held in position with a 3-0 Nylon through and through suture.  The patient was turned back over to anesthesia, and awakened, extubated, and taken to the PACU in satisfactory condition.  Dispo:   PACU to home  Plan: Ice, elevation, narcotic analgesia, steroid taper, and prophylactic antibiotics for the duration of indwelling nasal foreign bodies.  We will reevaluate the patient in the office in 6 days and remove the septal splints.  Return to work in 10 days, strenuous activities in two weeks.   Donyea Gafford H 07/12/2016 2:08 PM

## 2016-07-12 NOTE — Anesthesia Procedure Notes (Signed)
Procedure Name: Intubation Date/Time: 07/12/2016 1:03 PM Performed by: Andee PolesBUSH, Embry Manrique Pre-anesthesia Checklist: Patient identified, Emergency Drugs available, Suction available, Patient being monitored and Timeout performed Patient Re-evaluated:Patient Re-evaluated prior to inductionOxygen Delivery Method: Circle system utilized Preoxygenation: Pre-oxygenation with 100% oxygen Intubation Type: IV induction Ventilation: Mask ventilation without difficulty Grade View: Grade I Tube type: Oral Rae Tube size: 7.5 mm Number of attempts: 1 Placement Confirmation: ETT inserted through vocal cords under direct vision,  positive ETCO2 and breath sounds checked- equal and bilateral Tube secured with: Tape Dental Injury: Teeth and Oropharynx as per pre-operative assessment

## 2016-07-12 NOTE — H&P (Signed)
[name] [name],  [name] 45 y.o., male [id number]     Chief Complaint: Uncontrolled epistaxis, post septoplasty and turbinate reduction  HPI: The patient is a 45 year old white male who had plasty and trimming of his left inferior turbinate and endoscopic reduction of his right middle concha bullosa [facility] this afternoon. His surgery was uneventful and he was in the recovery room not have any problems and was a sniffing very hard. He sniffed third time and suddenly had acute onset of bleeding from his left nostril. Pressure was held and he was watched for about 20 minutes and it seemed like the bleeding was stopping. It started back up again so about 30 minutes later I placed bilateral sponge packs in the nostrils. He seemed to slow down but then continued to have bleeding in his left nostril coming around the packing. After watching him for a total of about an hour and 15 minutes without any success in stopping the bleeding he was taken back to the operating room to evaluate the nose and look for the source of bleeding. The pack was removed when he was found to have bleeding coming from the anterior ethmoid area on the left side, between the middle turbinate and the superior septum. Xerogel was stuffed into the anterior cleft and placed pressure in this area and this seemed to stop bleeding. Nasal packing was placed on both sides with sponge packs from the front of the nose to the nasopharynx. He's had no further bleeding since that time.  He had 300 mL of blood loss at time of surgery and 350 mL of old blood was suctioned from his stomach. He probably had a total of 1 L of blood loss in the postoperative period. It was felt that he would be best to be observed overnight and check his blood counts and make sure that he was at rest in stable and did not have any further bleeding before he was discharged home. He was transferred by ambulance to the hospital for observation overnight. He does not  have any further bleeding right now and will be evaluated again at noon tomorrow and discharged home if no further problems  PMH: Past Medical History:  Diagnosis Date  . Degenerative disc disease, lumbar   . Headache    sinus  . Hypertension     Surg Hx: Past Surgical History:  Procedure Laterality Date  . HIP SURGERY     laprascopic   . TENDON REPAIR Right    cut tendon repaired in R thumb  . VASECTOMY      FHx:   Family History  Problem Relation Age of Onset  . Hypertension Mother   . Hypertension Father    SocHx:  reports that he has never smoked. He has never used smokeless tobacco. He reports that he drinks about 8.4 oz of alcohol per week . He reports that he does not use drugs.  ALLERGIES:  Allergies  Allergen Reactions  . Coconut Oil Rash    Medications Prior to Admission  Medication Sig Dispense Refill  . alfuzosin (UROXATRAL) 10 MG 24 hr tablet Take 10 mg by mouth at bedtime.     Marland Kitchen. amLODipine (NORVASC) 5 MG tablet Take 5 mg by mouth daily.    Marland Kitchen. lisinopril (PRINIVIL,ZESTRIL) 10 MG tablet Take 10 mg by mouth daily.    . meloxicam (MOBIC) 15 MG tablet Take 15 mg by mouth daily.    . sildenafil (REVATIO) 20 MG tablet Take 20 mg  by mouth 1 day or 1 dose.    Marland Kitchen. tiZANidine (ZANAFLEX) 4 MG capsule Take 4 mg by mouth 1 day or 1 dose.    . traZODone (DESYREL) 50 MG tablet Take 50 mg by mouth at bedtime.      No results found for this or any previous visit (from the past 48 hour(s)). No results found.  JXB:JYNWGROS:Prior to his revisional surgery is not having any signs of infection or upper respiratory symptoms other than chronic nasal obstruction. He has a lot of headaches more to his right side probably secondary to the septal spur and pressure in his nose. He denied any shortness of breath or chest pain. He does not have any GI symptoms.  Height 6' (1.829 m), weight 77.7 kg (171 lb 4.8 oz).  PHYSICAL EXAM: Overall appearance his nose is packed and is little  uncomfortable from the from the packing there was some overall generalized headache. Head: Normocephalic Ears: Ear canals clear and drums are normal Nose: Packing both sides the nose but it dry no sign of bleeding Oral Cavity: No blood staining the back of throat slight bruising of his uvula Oral Pharynx/Hypopharynx/Larynx: his hypopharynx and larynx normal Neuro: Alert and oriented 3 Neck: Supple without any pain  Studies Reviewed: None    Assessment/Plan Patient had epistaxis post septoplasty and turbinate reduction that came from his left anterior ethmoid. This is no longer is bleeding. We'll plan to watch him overnight tonight and make sure is doing well. We'll get a CBC. We'll plan to discharge tomorrow at noon if no further bleeding problems, and remove packing on Monday in the office.  Masiah Lewing H 07/12/2016, 9:05 PM

## 2016-07-12 NOTE — Addendum Note (Signed)
Addendum  created 07/12/16 1528 by Harolyn RutherfordJoshua Kaleisha Bhargava, DO   Order list changed

## 2016-07-12 NOTE — Anesthesia Preprocedure Evaluation (Signed)
Anesthesia Evaluation  Patient identified by MRN, date of birth, ID band Patient awake    Reviewed: Allergy & Precautions, NPO status , Patient's Chart, lab work & pertinent test results  Airway Mallampati: I  TM Distance: >3 FB Neck ROM: Full    Dental no notable dental hx.    Pulmonary neg pulmonary ROS,    Pulmonary exam normal        Cardiovascular hypertension, Pt. on medications Normal cardiovascular exam     Neuro/Psych  Headaches,    GI/Hepatic negative GI ROS, Neg liver ROS,   Endo/Other  negative endocrine ROS  Renal/GU negative Renal ROS     Musculoskeletal  (+) Arthritis , Osteoarthritis,    Abdominal   Peds  Hematology negative hematology ROS (+)   Anesthesia Other Findings   Reproductive/Obstetrics                             Anesthesia Physical Anesthesia Plan  ASA: II  Anesthesia Plan: General   Post-op Pain Management:    Induction: Intravenous  Airway Management Planned: Oral ETT  Additional Equipment:   Intra-op Plan:   Post-operative Plan:   Informed Consent: I have reviewed the patients History and Physical, chart, labs and discussed the procedure including the risks, benefits and alternatives for the proposed anesthesia with the patient or authorized representative who has indicated his/her understanding and acceptance.     Plan Discussed with: CRNA  Anesthesia Plan Comments:         Anesthesia Quick Evaluation

## 2016-07-12 NOTE — H&P (Addendum)
  H&P has been reviewed and no changes necessary. To be downloaded later.  The patient has had oozing from his left nostril that has not stopped over the past 1 hour. His blood pressure has been elevated, despite iv blood pressure meds. Packing was placed in both sides of the nose, but continues to bleed from left side. Decided it was best to go back to the OR to look for bleeding site and cauterize to control. Pt and wife understand and agree.

## 2016-07-12 NOTE — Progress Notes (Signed)
Patient began to bleed from right nare, pressure applied and Juengel at bedside. At 1440 patient began to bleed again, pressure reapplied and given 2 sprays of afrin up each nare x's 2.

## 2016-07-12 NOTE — Addendum Note (Signed)
Addendum  created 07/12/16 1826 by Harolyn RutherfordJoshua Steen Bisig, DO   Order list changed

## 2016-07-12 NOTE — Progress Notes (Addendum)
Patient going back to OR for epistaxis control. VSS    Note was written at 1620 not 0420

## 2016-07-12 NOTE — Addendum Note (Signed)
Addendum  created 07/12/16 1524 by Harolyn RutherfordJoshua Arline Ketter, DO   Order list changed

## 2016-07-12 NOTE — Plan of Care (Signed)
Problem: Fluid Volume: Goal: Ability to maintain a balanced intake and output will improve Outcome: Progressing Nasal packing intact; minor bleeding noted with getting up OOB to void.

## 2016-07-12 NOTE — Transfer of Care (Signed)
Immediate Anesthesia Transfer of Care Note  Patient: [name] [name]  Procedure(s) Performed: Procedure(s): NASAL ENDOSCOPY WITH EPISTAXIS CONTROL (Bilateral)  Patient Location: PACU  Anesthesia Type: General  Level of Consciousness: awake, alert  and patient cooperative  Airway and Oxygen Therapy: Patient Spontanous Breathing and Patient connected to supplemental oxygen  Post-op Assessment: Post-op Vital signs reviewed, Patient's Cardiovascular Status Stable, Respiratory Function Stable, Patent Airway and No signs of Nausea or vomiting  Post-op Vital Signs: Reviewed and stable  Complications: No apparent anesthesia complications

## 2016-07-12 NOTE — Anesthesia Preprocedure Evaluation (Signed)
Anesthesia Evaluation  Patient identified by MRN, date of birth, ID band Patient awake    Reviewed: Allergy & Precautions, NPO status , Patient's Chart, lab work & pertinent test results  Airway Mallampati: I  TM Distance: >3 FB Neck ROM: Full    Dental no notable dental hx.    Pulmonary neg pulmonary ROS,    Pulmonary exam normal        Cardiovascular hypertension, Pt. on medications Normal cardiovascular exam     Neuro/Psych  Headaches,    GI/Hepatic negative GI ROS, Neg liver ROS,   Endo/Other  negative endocrine ROS  Renal/GU negative Renal ROS     Musculoskeletal  (+) Arthritis , Osteoarthritis,    Abdominal   Peds  Hematology negative hematology ROS (+)   Anesthesia Other Findings   Reproductive/Obstetrics                             Anesthesia Physical  Anesthesia Plan  ASA: II  Anesthesia Plan: General   Post-op Pain Management:    Induction: Intravenous  Airway Management Planned: Oral ETT  Additional Equipment:   Intra-op Plan:   Post-operative Plan:   Informed Consent: I have reviewed the patients History and Physical, chart, labs and discussed the procedure including the risks, benefits and alternatives for the proposed anesthesia with the patient or authorized representative who has indicated his/her understanding and acceptance.     Plan Discussed with: CRNA  Anesthesia Plan Comments: (Urgent return to OR for continued bleeding in PACU)        Anesthesia Quick Evaluation

## 2016-07-12 NOTE — H&P (Signed)
  H&P has been reviewed and no changes necessary. To be downloaded later. 

## 2016-07-12 NOTE — Progress Notes (Signed)
EM[facility] has been called to [facility] overnight; awaiting available transport at this time. VSS, SR on tele monitor. Will continue to monitor pending transfer.

## 2016-07-12 NOTE — Progress Notes (Signed)
Dr. Elenore RotaJuengel at bedside, packing patient's nares due to unontrolled heavy bleeding. Morrie SheldonAshley, RN assisting MD. Pt tolerating procedure well, VSS. Administering pain meds as needed.

## 2016-07-13 ENCOUNTER — Encounter: Payer: Self-pay | Admitting: *Deleted

## 2016-07-13 DIAGNOSIS — J342 Deviated nasal septum: Secondary | ICD-10-CM | POA: Diagnosis not present

## 2016-07-13 LAB — CBC
HEMATOCRIT: 35.4 % — AB (ref 40.0–52.0)
HEMOGLOBIN: 12.6 g/dL — AB (ref 13.0–18.0)
MCH: 31.1 pg (ref 26.0–34.0)
MCHC: 35.6 g/dL (ref 32.0–36.0)
MCV: 87.4 fL (ref 80.0–100.0)
Platelets: 173 10*3/uL (ref 150–440)
RBC: 4.06 MIL/uL — AB (ref 4.40–5.90)
RDW: 12.8 % (ref 11.5–14.5)
WBC: 11.1 10*3/uL — AB (ref 3.8–10.6)

## 2016-07-13 NOTE — Discharge Summary (Signed)
Physician Discharge Summary  Patient ID: [name] LenzDereck Shawn [name] MRN: [id number] DOB/AGE: 02-27-1971 45 y.o.  Admit date: 07/12/2016 Discharge date: 07/13/2016  Admission Diagnoses: Uncontrolled epistaxis post septal surgery.  Discharge Diagnoses: Epistaxis now controlled. Active Problems:   Epistaxis   Severe epistaxis   Discharged Condition: stable  Hospital Course: Patient was admitted last night for observation to make sure that he did not have any further bleeding. His packing is remain in place and there is been no active bleeding noted through the night. He has remained at bed rest and blood pressure has remained fairly stable. His pain has been controlled with the oral medications and he seems to be well enough to be discharged home, to stay at bedrest.  Consults: None  Significant Diagnostic Studies: labs: His hemoglobin was 12.6  Treatments: IV hydration  Discharge Exam: Blood pressure 126/66, pulse 81, temperature 98.1 F (36.7 C), temperature source Oral, resp. rate 18, height 6' (1.829 m), weight 77.7 kg (171 lb 4.8 oz), SpO2 98 %. Nasal packing is intact. No blood staining in his throat. No active bleeding at this time.  Disposition: 01-Home or Self Care  Discharge Instructions    Bed rest    Complete by:  As directed    Call MD for:  hives    Complete by:  As directed    Call MD for:  persistant nausea and vomiting    Complete by:  As directed    Call MD for:  redness, tenderness, or signs of infection (pain, swelling, redness, odor or green/yellow discharge around incision site)    Complete by:  As directed    Call MD for:  temperature >100.4    Complete by:  As directed    Diet general    Complete by:  As directed        Medication List    STOP taking these medications   meloxicam 15 MG tablet Commonly known as:  MOBIC   tiZANidine 4 MG capsule Commonly known as:  ZANAFLEX     TAKE these medications   alfuzosin 10 MG 24 hr tablet Commonly known  as:  UROXATRAL Take 10 mg by mouth at bedtime.   amLODipine 5 MG tablet Commonly known as:  NORVASC Take 5 mg by mouth daily.   lisinopril 10 MG tablet Commonly known as:  PRINIVIL,ZESTRIL Take 10 mg by mouth daily.   sildenafil 20 MG tablet Commonly known as:  REVATIO Take 20 mg by mouth 1 day or 1 dose.   traZODone 50 MG tablet Commonly known as:  DESYREL Take 50 mg by mouth at bedtime.        Signed: Sigrid Schwebach H 07/13/2016, 12:50 PM

## 2016-07-13 NOTE — Progress Notes (Signed)
Pt A and O x 4. VSS. Pt tolerating diet well. Minimal complaints of pain with meds given to control, no nausea. IV removed intact, prescriptions given. Pt voiced understanding of discharge instructions with no further questions. Pt given nasal drip dressing and gauze to take home. Pt discharged via wheelchair with axillary.

## 2016-07-13 NOTE — Plan of Care (Signed)
Problem: Physical Regulation: Goal: Ability to maintain clinical measurements within normal limits will improve Outcome: Progressing No new bleeding overnight; pain meds effective.

## 2017-08-18 ENCOUNTER — Other Ambulatory Visit: Payer: Self-pay

## 2017-08-18 ENCOUNTER — Encounter: Payer: Self-pay | Admitting: Gynecology

## 2017-08-18 ENCOUNTER — Ambulatory Visit
Admission: EM | Admit: 2017-08-18 | Discharge: 2017-08-18 | Disposition: A | Discharge status: Home / Self Care | Payer: 59 | Attending: Family Medicine | Admitting: Family Medicine

## 2017-08-18 DIAGNOSIS — H6502 Acute serous otitis media, left ear: Secondary | ICD-10-CM

## 2017-08-18 MED ORDER — AMOXICILLIN 875 MG PO TABS: 875.0000 mg | ORAL_TABLET | Freq: Two times a day (BID) | ORAL | 0 refills | Status: DC

## 2017-08-18 NOTE — ED Provider Notes (Signed)
[facility]    CSN: [id number] Arrival date & time: 08/18/17  1003     History   Chief Complaint Chief Complaint  Patient presents with  . Jaw Pain  . Facial Pain  . Otalgia    HPI Anthonny Thurman CoyerShawn [name] is a 46 y.o. male.   46 yo male with a 2-3 days h/o left ear pain and left jaw pain as well as nasal congestion. Denies any fevers, chills.    The history is provided by the patient.  Otalgia    Past Medical History:  Diagnosis Date  . Degenerative disc disease, lumbar   . Headache    sinus  . Hypertension     Patient Active Problem List   Diagnosis Date Noted  . Epistaxis 07/12/2016  . Severe epistaxis 07/12/2016    Past Surgical History:  Procedure Laterality Date  . ENDOSCOPIC CONCHA BULLOSA RESECTION Right 07/12/2016   Procedure: ENDOSCOPIC CONCHA BULLOSA RESECTION;  Surgeon: Vernie MurdersPaul Juengel, MD;  [facility];  Service: ENT;  Laterality: Right;  . HIP SURGERY     laprascopic   . NASAL ENDOSCOPY WITH EPISTAXIS CONTROL Bilateral 07/12/2016   Procedure: NASAL ENDOSCOPY WITH EPISTAXIS CONTROL;  Surgeon: Vernie MurdersPaul Juengel, MD;  [facility];  Service: ENT;  Laterality: Bilateral;  . SEPTOPLASTY N/A 07/12/2016   Procedure: SEPTOPLASTY;  Surgeon: Vernie MurdersPaul Juengel, MD;  [facility];  Service: ENT;  Laterality: N/A;  . TENDON REPAIR Right    cut tendon repaired in R thumb  . TURBINATE REDUCTION Left 07/12/2016   Procedure: INFERIOR TURBINATE REDUCTION;  Surgeon: Vernie MurdersPaul Juengel, MD;  [facility];  Service: ENT;  Laterality: Left;  [name] [name]. VASECTOMY         Home Medications    Prior to Admission medications   Medication Sig Start Date End Date Taking? Authorizing Provider  alfuzosin (UROXATRAL) 10 MG 24 hr tablet Take 10 mg by mouth at bedtime.    Yes [provider]  amLODipine (NORVASC) 5 MG tablet Take 5 mg by mouth daily.   Yes [provider]  lisinopril (PRINIVIL,ZESTRIL) 10 MG  tablet Take 10 mg by mouth daily.   Yes [provider]  sildenafil (REVATIO) 20 MG tablet Take 20 mg by mouth 1 day or 1 dose.   Yes [provider]  traZODone (DESYREL) 50 MG tablet Take 50 mg by mouth at bedtime.   Yes [provider]  amoxicillin (AMOXIL) 875 MG tablet Take 1 tablet (875 mg total) by mouth 2 (two) times daily. 08/18/17   [name] [name], Derita Michelsen, MD    Family History Family History  Problem Relation Age of Onset  . Hypertension Mother   . Hypertension Father     Social History Social History   Tobacco Use  . Smoking status: Never Smoker  . Smokeless tobacco: Never Used  Substance Use Topics  . Alcohol use: Yes    Alcohol/week: 8.4 oz    Types: 14 Cans of beer per week  . Drug use: No     Allergies   Coconut oil   Review of Systems Review of Systems  HENT: Positive for ear pain.      Physical Exam Triage Vital Signs ED Triage Vitals  Enc Vitals Group     BP 08/18/17 1027 123/78     Pulse Rate 08/18/17 1027 96     Resp 08/18/17 1027 16     Temp 08/18/17 1027 98.6 F (37 C)  Temp Source 08/18/17 1027 Oral     SpO2 08/18/17 1027 97 %     Weight 08/18/17 1027 180 lb (81.6 kg)     Height 08/18/17 1027 5\' 11"  (1.803 m)     Head Circumference --      Peak Flow --      Pain Score 08/18/17 1028 5     Pain Loc --      Pain Edu? --      Excl. in GC? --    No data found.  Updated Vital Signs BP 123/78 (BP Location: Left Arm)   Pulse 96   Temp 98.6 F (37 C) (Oral)   Resp 16   Ht 5\' 11"  (1.803 m)   Wt 180 lb (81.6 kg)   SpO2 97%   BMI 25.10 kg/m   Visual Acuity Right Eye Distance:   Left Eye Distance:   Bilateral Distance:    Right Eye Near:   Left Eye Near:    Bilateral Near:     Physical Exam  Constitutional: He appears well-developed and well-nourished. No distress.  HENT:  Head: Normocephalic and atraumatic.  Right Ear: Tympanic membrane and ear canal normal.  Left Ear: Ear canal normal. Tympanic  membrane is erythematous and bulging. A middle ear effusion is present.  Nose: Nose normal.  Mouth/Throat: Uvula is midline, oropharynx is clear and moist and mucous membranes are normal.  Cardiovascular: Normal rate.  Pulmonary/Chest: Effort normal. No respiratory distress.  Skin: He is not diaphoretic.  Nursing note and vitals reviewed.    UC Treatments / Results  Labs (all labs ordered are listed, but only abnormal results are displayed) Labs Reviewed - No data to display  EKG  EKG Interpretation None       Radiology No results found.  Procedures Procedures (including critical care time)  Medications Ordered in UC Medications - No data to display   Initial Impression / Assessment and Plan / UC Course  I have reviewed the triage vital signs and the nursing notes.  Pertinent labs & imaging results that were available during my care of the patient were reviewed by me and considered in my medical decision making (see chart for details).      Final Clinical Impressions(s) / UC Diagnoses   Final diagnoses:  Acute serous otitis media of left ear, recurrence not specified    ED Discharge Orders        Ordered    amoxicillin (AMOXIL) 875 MG tablet  2 times daily     08/18/17 1107     1. diagnosis reviewed with patient 2. rx as per orders above; reviewed possible side effects, interactions, risks and benefits  3. Recommend supportive treatment with otc analgesics 4. Follow-up prn if symptoms worsen or don't improve  Controlled Substance Prescriptions [place] Controlled Substance Registry consulted? Not Applicable   [name] [name], Pio Eatherly, MD 08/18/17 1247

## 2017-08-18 NOTE — ED Triage Notes (Signed)
Patient c/o x 3 days left jaw / facial and ear pain. Per patient question sinus infection.

## 2017-08-20 ENCOUNTER — Other Ambulatory Visit: Payer: Self-pay

## 2017-08-20 ENCOUNTER — Ambulatory Visit (INDEPENDENT_AMBULATORY_CARE_PROVIDER_SITE_OTHER): Payer: 59

## 2017-08-20 ENCOUNTER — Encounter: Payer: Self-pay | Admitting: Emergency Medicine

## 2017-08-20 ENCOUNTER — Ambulatory Visit
Admission: EM | Admit: 2017-08-20 | Discharge: 2017-08-20 | Disposition: A | Discharge status: Home / Self Care | Payer: 59 | Attending: Family Medicine | Admitting: Family Medicine

## 2017-08-20 DIAGNOSIS — R6884 Jaw pain: Secondary | ICD-10-CM

## 2017-08-20 DIAGNOSIS — H9202 Otalgia, left ear: Secondary | ICD-10-CM

## 2017-08-20 DIAGNOSIS — M26629 Arthralgia of temporomandibular joint, unspecified side: Secondary | ICD-10-CM

## 2017-08-20 DIAGNOSIS — H6502 Acute serous otitis media, left ear: Secondary | ICD-10-CM

## 2017-08-20 MED ORDER — CYCLOBENZAPRINE HCL 10 MG PO TABS: 10.0000 mg | ORAL_TABLET | Freq: Every day | ORAL | 0 refills | Status: DC

## 2017-08-20 MED ORDER — KETOROLAC TROMETHAMINE 10 MG PO TABS: 10.0000 mg | ORAL_TABLET | Freq: Three times a day (TID) | ORAL | 0 refills | Status: DC | PRN

## 2017-08-20 MED ORDER — KETOROLAC TROMETHAMINE 60 MG/2ML IM SOLN
60.0000 mg | Freq: Once | INTRAMUSCULAR | Status: AC
  Administered 2017-08-20: 60 mg via INTRAMUSCULAR

## 2017-08-20 MED ORDER — LIDOCAINE VISCOUS 2 % MT SOLN: 20.0000 mL | OROMUCOSAL | 0 refills | Status: DC | PRN

## 2017-08-20 NOTE — ED Triage Notes (Signed)
Patent in today with continued left ear pain and worsening left jaw pain. Patient was seen 08/18/17 and treated for an ear infection.

## 2017-08-22 ENCOUNTER — Telehealth: Payer: Self-pay | Admitting: *Deleted

## 2017-08-22 NOTE — Telephone Encounter (Signed)
Left VM message [facility]. Called pharmacy per Dr Judd Gaudieronty and prescribed Augmentin 875 mg 1 po bid x 10 days #20 . Spoke with Pharmacist Caryn BeeKevin and also pt called back and he was notified antibiotics were call in.

## 2017-09-17 ENCOUNTER — Encounter: Payer: Self-pay | Admitting: Emergency Medicine

## 2017-09-17 ENCOUNTER — Ambulatory Visit
Admission: EM | Admit: 2017-09-17 | Discharge: 2017-09-17 | Disposition: A | Discharge status: Home / Self Care | Payer: 59 | Attending: Family Medicine | Admitting: Family Medicine

## 2017-09-17 ENCOUNTER — Other Ambulatory Visit: Payer: Self-pay

## 2017-09-17 DIAGNOSIS — L959 Vasculitis limited to the skin, unspecified: Secondary | ICD-10-CM

## 2017-09-17 DIAGNOSIS — R21 Rash and other nonspecific skin eruption: Secondary | ICD-10-CM | POA: Diagnosis not present

## 2017-09-17 MED ORDER — METHYLPREDNISOLONE 4 MG PO TBPK: ORAL_TABLET | ORAL | 0 refills | Status: DC

## 2017-09-17 NOTE — ED Provider Notes (Signed)
[facility]    CSN: [id number] Arrival date & time: 09/17/17  1555     History   Chief Complaint Chief Complaint  Patient presents with  . Rash    HPI Shiva Bertil Brickey is a 47 y.o. male.   HPI  47 year old male presents with a rash confined to his anterior pelvis.  Had a rash since Sunday 2 days prior to this visit.  Is not itchy.  It has discomfort with contact with his underwear.  This is particularly noticeable with the waistband.  It does not extend laterally or posteriorly.  He has had no other areas.  He has no burning pain has had no discharge or weeping.  He denies any fever chills night sweats.  He has had no joint pain or muscle pain.  He had a prescription for penicillin that he took later part of December.       Past Medical History:  Diagnosis Date  . Degenerative disc disease, lumbar   . Headache    sinus  . Hypertension     Patient Active Problem List   Diagnosis Date Noted  . Epistaxis 07/12/2016  . Severe epistaxis 07/12/2016    Past Surgical History:  Procedure Laterality Date  . ENDOSCOPIC CONCHA BULLOSA RESECTION Right 07/12/2016   Procedure: ENDOSCOPIC CONCHA BULLOSA RESECTION;  Surgeon: Vernie Murders, MD;  Location: [facility];  Service: ENT;  Laterality: Right;  . HIP SURGERY     laprascopic   . NASAL ENDOSCOPY WITH EPISTAXIS CONTROL Bilateral 07/12/2016   Procedure: NASAL ENDOSCOPY WITH EPISTAXIS CONTROL;  Surgeon: Vernie Murders, MD;  [facility];  Service: ENT;  Laterality: Bilateral;  . SEPTOPLASTY N/A 07/12/2016   Procedure: SEPTOPLASTY;  Surgeon: Vernie Murders, MD;  Location: [facility];  Service: ENT;  Laterality: N/A;  . TENDON REPAIR Right    cut tendon repaired in R thumb  . TURBINATE REDUCTION Left 07/12/2016   Procedure: INFERIOR TURBINATE REDUCTION;  Surgeon: Vernie Murders, MD;  [facility];  Service: ENT;  Laterality: Left;  Marland Kitchen VASECTOMY         Home Medications      Prior to Admission medications   Medication Sig Start Date End Date Taking? Authorizing Provider  amLODipine (NORVASC) 5 MG tablet Take 5 mg by mouth daily.   Yes [provider]  lisinopril (PRINIVIL,ZESTRIL) 10 MG tablet Take 10 mg by mouth daily.   Yes [provider]  sildenafil (REVATIO) 20 MG tablet Take 20 mg by mouth 1 day or 1 dose.   Yes [provider]  traZODone (DESYREL) 50 MG tablet Take 50 mg by mouth at bedtime.   Yes [provider]  alfuzosin (UROXATRAL) 10 MG 24 hr tablet Take 10 mg by mouth at bedtime.     [provider]  methylPREDNISolone (MEDROL DOSEPAK) 4 MG TBPK tablet Take per package instructions 09/17/17   Lutricia Feil, PA-C    Family History Family History  Problem Relation Age of Onset  . Hypertension Mother   . Hypertension Father     Social History Social History   Tobacco Use  . Smoking status: Never Smoker  . Smokeless tobacco: Never Used  Substance Use Topics  . Alcohol use: Yes    Alcohol/week: 8.4 oz    Types: 14 Cans of beer per week  . Drug use: No     Allergies   Coconut oil   Review of Systems Review of Systems  Constitutional:  Negative for activity change, chills, fatigue and fever.  Skin: Positive for rash.  All other systems reviewed and are negative.    Physical Exam Triage Vital Signs ED Triage Vitals  Enc Vitals Group     BP 09/17/17 1606 140/89     Pulse Rate 09/17/17 1606 97     Resp 09/17/17 1606 16     Temp 09/17/17 1606 98.6 F (37 C)     Temp Source 09/17/17 1606 Oral     SpO2 09/17/17 1606 99 %     Weight 09/17/17 1603 175 lb (79.4 kg)     Height 09/17/17 1603 5\' 11"  (1.803 m)     Head Circumference --      Peak Flow --      Pain Score 09/17/17 1603 0     Pain Loc --      Pain Edu? --      Excl. in GC? --    No data found.  Updated Vital Signs BP 140/89 (BP Location: Left Arm)   Pulse 97   Temp 98.6 F (37 C) (Oral)   Resp 16   Ht 5\' 11"   (1.803 m)   Wt 175 lb (79.4 kg)   SpO2 99%   BMI 24.41 kg/m   Visual Acuity Right Eye Distance:   Left Eye Distance:   Bilateral Distance:    Right Eye Near:   Left Eye Near:    Bilateral Near:     Physical Exam  Constitutional: He is oriented to person, place, and time. He appears well-developed and well-nourished. No distress.  HENT:  Head: Normocephalic.  Eyes: Pupils are equal, round, and reactive to light.  Neck: Normal range of motion.  Pulmonary/Chest: Effort normal and breath sounds normal.  Musculoskeletal: Normal range of motion.  Neurological: He is alert and oriented to person, place, and time.  Skin: Skin is warm. Rash noted. He is not diaphoretic.  Refer to photographs for detail.  Excoriations are seen.  The rash is non-blanchable.  Psychiatric: He has a normal mood and affect. His behavior is normal. Judgment and thought content normal.  Nursing note and vitals reviewed.          UC Treatments / Results  Labs (all labs ordered are listed, but only abnormal results are displayed) Labs Reviewed - No data to display  EKG  EKG Interpretation None       Radiology No results found.  Procedures Procedures (including critical care time)  Medications Ordered in UC Medications - No data to display   Initial Impression / Assessment and Plan / UC Course  I have reviewed the triage vital signs and the nursing notes.  Pertinent labs & imaging results that were available during my care of the patient were reviewed by me and considered in my medical decision making (see chart for details).    Plan: 1. Test/x-ray results and diagnosis reviewed with patient 2. rx as per orders; risks, benefits, potential side effects reviewed with patient 3. Recommend supportive treatment with of a short course of prednisone.  Arrange appointment with a dermatologist as soon as possible. 4. F/u prn if symptoms worsen or don't improve   Final Clinical  Impressions(s) / UC Diagnoses   Final diagnoses:  Vasculitis of skin    ED Discharge Orders        Ordered    methylPREDNISolone (MEDROL DOSEPAK) 4 MG TBPK tablet     09/17/17 1717       Controlled Substance Prescriptions  [place] Controlled Substance Registry consulted? Not Applicable   Lutricia Feil, PA-C 09/17/17 1727

## 2017-09-17 NOTE — Medical Student Note (Signed)
[facility] Provider Student Note For educational purposes for Medical, PA and NP students only and not part of the legal medical record.   CSN: [id number] Arrival date & time: 09/17/17  1555     History   Chief Complaint Chief Complaint  Patient presents with  . Rash    HPI [name] [name] is a 47 y.o. male.  HPI  47 year old white male presents with mildly painful and itchy rash around anterior waist along his waistband that began 2 days ago and has started to spread a little to his abdomen. Denies fever/chills, drainage, new exposures or contacts, angioedema. His only known contact allergy is coconut oil and chlorine and he went to his daughter's swim meet the day prior to the rash, but denies coming in contact with any pool water. He tried Zyrtec without improvement. He was prescribed a 10 day course of Augmentin on 08/22/17 for sinusitis and finished it on 12/30. No other known allergies. He receives cupping and acupuncture for back pain and last appointment was yesterday. He has always tolerated those well.   Past Medical History:  Diagnosis Date  . Degenerative disc disease, lumbar   . Headache    sinus  . Hypertension     Patient Active Problem List   Diagnosis Date Noted  . Epistaxis 07/12/2016  . Severe epistaxis 07/12/2016    Past Surgical History:  Procedure Laterality Date  . ENDOSCOPIC CONCHA BULLOSA RESECTION Right 07/12/2016   Procedure: ENDOSCOPIC CONCHA BULLOSA RESECTION;  Surgeon: Vernie Murders, MD;  Location: [facility];  Service: ENT;  Laterality: Right;  . HIP SURGERY     laprascopic   . NASAL ENDOSCOPY WITH EPISTAXIS CONTROL Bilateral 07/12/2016   Procedure: NASAL ENDOSCOPY WITH EPISTAXIS CONTROL;  Surgeon: Vernie Murders, MD;  Location: [facility];  Service: ENT;  Laterality: Bilateral;  . SEPTOPLASTY N/A 07/12/2016   Procedure: SEPTOPLASTY;  Surgeon: Vernie Murders, MD;  Location: [facility];  Service: ENT;   Laterality: N/A;  . TENDON REPAIR Right    cut tendon repaired in R thumb  . TURBINATE REDUCTION Left 07/12/2016   Procedure: INFERIOR TURBINATE REDUCTION;  Surgeon: Vernie Murders, MD;  Location: [facility];  Service: ENT;  Laterality: Left;  Marland Kitchen VASECTOMY         Home Medications    Prior to Admission medications   Medication Sig Start Date End Date Taking? Authorizing Provider  amLODipine (NORVASC) 5 MG tablet Take 5 mg by mouth daily.   Yes [provider]  lisinopril (PRINIVIL,ZESTRIL) 10 MG tablet Take 10 mg by mouth daily.   Yes [provider]  sildenafil (REVATIO) 20 MG tablet Take 20 mg by mouth 1 day or 1 dose.   Yes [provider]  traZODone (DESYREL) 50 MG tablet Take 50 mg by mouth at bedtime.   Yes [provider]  alfuzosin (UROXATRAL) 10 MG 24 hr tablet Take 10 mg by mouth at bedtime.     [provider]  methylPREDNISolone (MEDROL DOSEPAK) 4 MG TBPK tablet Take per package instructions 09/17/17   Lutricia Feil, PA-C    Family History Family History  Problem Relation Age of Onset  . Hypertension Mother   . Hypertension Father     Social History Social History   Tobacco Use  . Smoking status: Never Smoker  . Smokeless tobacco: Never Used  Substance Use Topics  . Alcohol use: Yes    Alcohol/week: 8.4 oz  Types: 14 Cans of beer per week  . Drug use: No     Allergies   Coconut oil   Review of Systems Review of Systems  Constitutional: Negative for chills and fever.  HENT: Negative for ear pain and sore throat.   Eyes: Negative for pain and visual disturbance.  Respiratory: Negative for cough and shortness of breath.   Cardiovascular: Negative for chest pain and palpitations.  Gastrointestinal: Negative for abdominal pain and vomiting.  Genitourinary: Negative for dysuria and hematuria.  Musculoskeletal: Negative for arthralgias and back pain.  Skin: Positive for rash. Negative for color  change.  Neurological: Negative for seizures and syncope.  All other systems reviewed and are negative.    Physical Exam Updated Vital Signs BP 140/89 (BP Location: Left Arm)   Pulse 97   Temp 98.6 F (37 C) (Oral)   Resp 16   Ht 5\' 11"  (1.803 m)   Wt 79.4 kg   SpO2 99%   BMI 24.41 kg/m   Physical Exam  Constitutional: He appears well-developed and well-nourished.  HENT:  Head: Normocephalic and atraumatic.  Eyes: Conjunctivae are normal.  Neck: Neck supple.  Cardiovascular: Normal rate and regular rhythm.  No murmur heard. Pulmonary/Chest: Effort normal and breath sounds normal. No respiratory distress.  Abdominal: Soft. There is no tenderness.  Musculoskeletal: He exhibits no edema.  Neurological: He is alert.  Skin: Skin is warm and dry. Rash noted.  Pinpoint, well demarcated, palpable, nonblanching petechiae along anterior hip that crosses the midline. Does not affect scrotum or genitalia. No pustules, induration, drainage.   Psychiatric: He has a normal mood and affect.  Nursing note and vitals reviewed.    ED Treatments / Results  Labs (all labs ordered are listed, but only abnormal results are displayed) Labs Reviewed - No data to display  EKG  EKG Interpretation None       Radiology No results found.  Procedures Procedures (including critical care time)  Medications Ordered in ED Medications - No data to display   Initial Impression / Assessment and Plan / ED Course  I have reviewed the triage vital signs and the nursing notes.  Pertinent labs & imaging results that were available during my care of the patient were reviewed by me and considered in my medical decision making (see chart for details).     47 year old afebrile, non-tachycardic male in no acute distress presents with rash along his waistband x 3 days, not improving and spreading upwards towards abdomen. The origin of the rash is unclear at this time. Dr. Adriana Simasook was consulted and he  agreed that given the non-blanching petechiae appearance, the rash looks vasculitic. Will prescribe 6 day course Medrol DosePak and advised patient to follow up with dermatology if symptoms do not improve within the next 3-5 days. Patient is agreeable to this plan.    Final Clinical Impressions(s) / ED Diagnoses   Final diagnoses:  Vasculitis of skin    New Prescriptions Discharge Medication List as of 09/17/2017  5:20 PM    START taking these medications   Details  methylPREDNISolone (MEDROL DOSEPAK) 4 MG TBPK tablet Take per package instructions, Normal

## 2017-09-17 NOTE — ED Triage Notes (Signed)
Patient c/o rash that goes around his waist band since Sunday.

## 2017-09-20 ENCOUNTER — Telehealth: Payer: Self-pay | Admitting: *Deleted

## 2017-09-20 NOTE — Telephone Encounter (Signed)
Called patient, patient reported rash has resolved. Advised patient to follow up with PCP or [facility] if rash returns.

## 2018-09-26 IMAGING — CR DG MANDIBLE 1-3V
4 series · 4 of 4 positions shown · non-contrast
Comparison: None.

CLINICAL DATA: 46-year-old male with left jaw and ear pain.
Recently treated for your infection.

EXAM:
MANDIBLE - 1-3 VIEW

[mandible pa (1 of 2)]
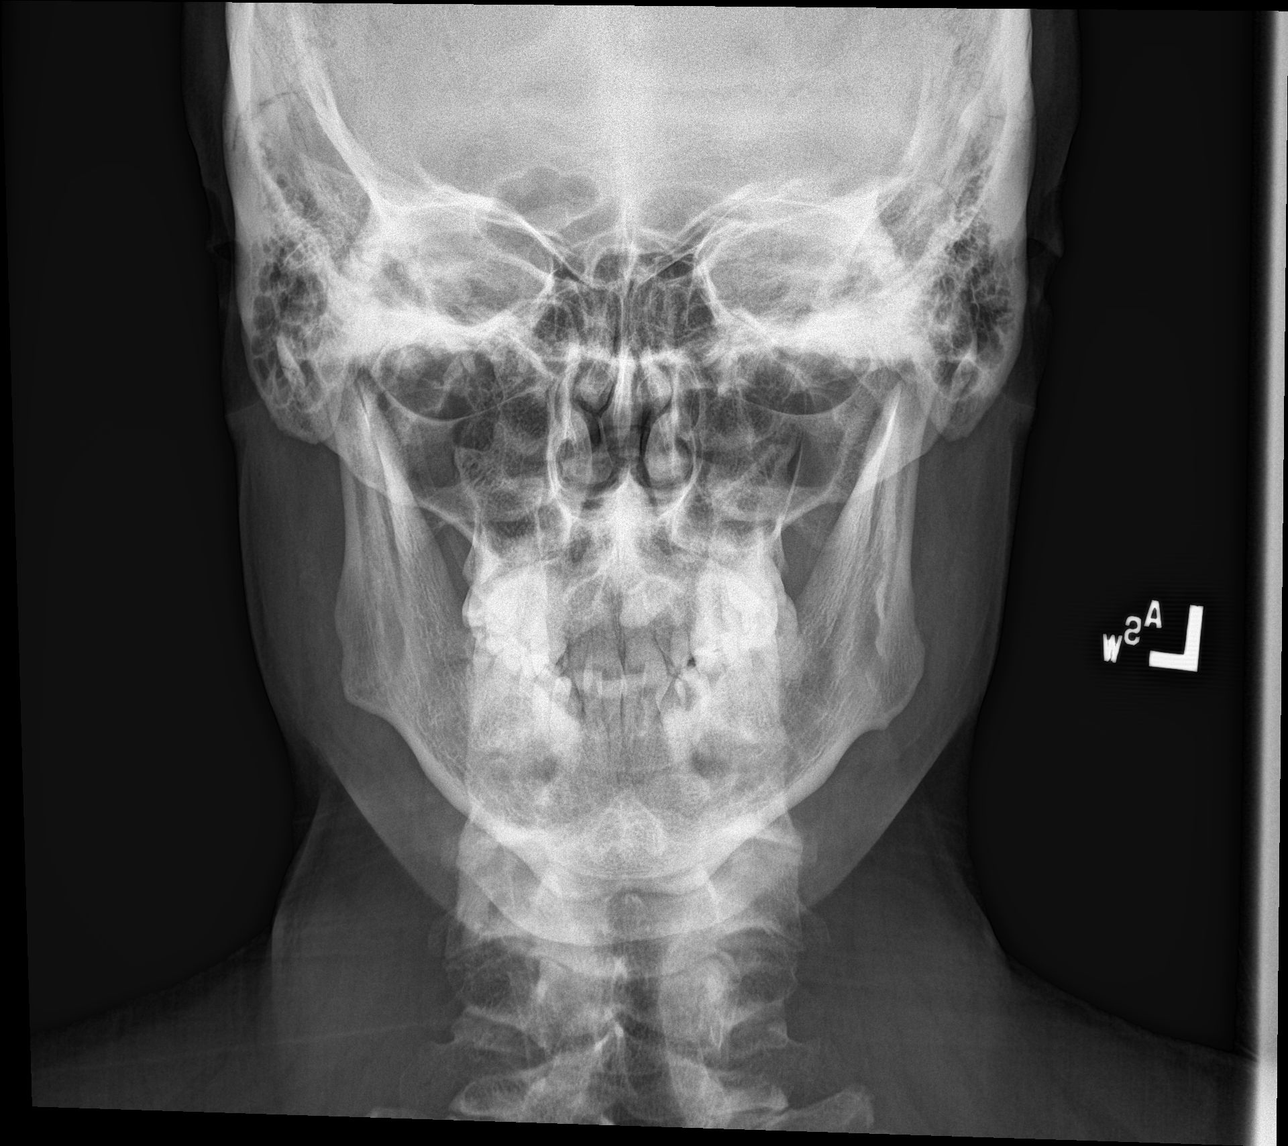

[mandible obl (1 of 2)]
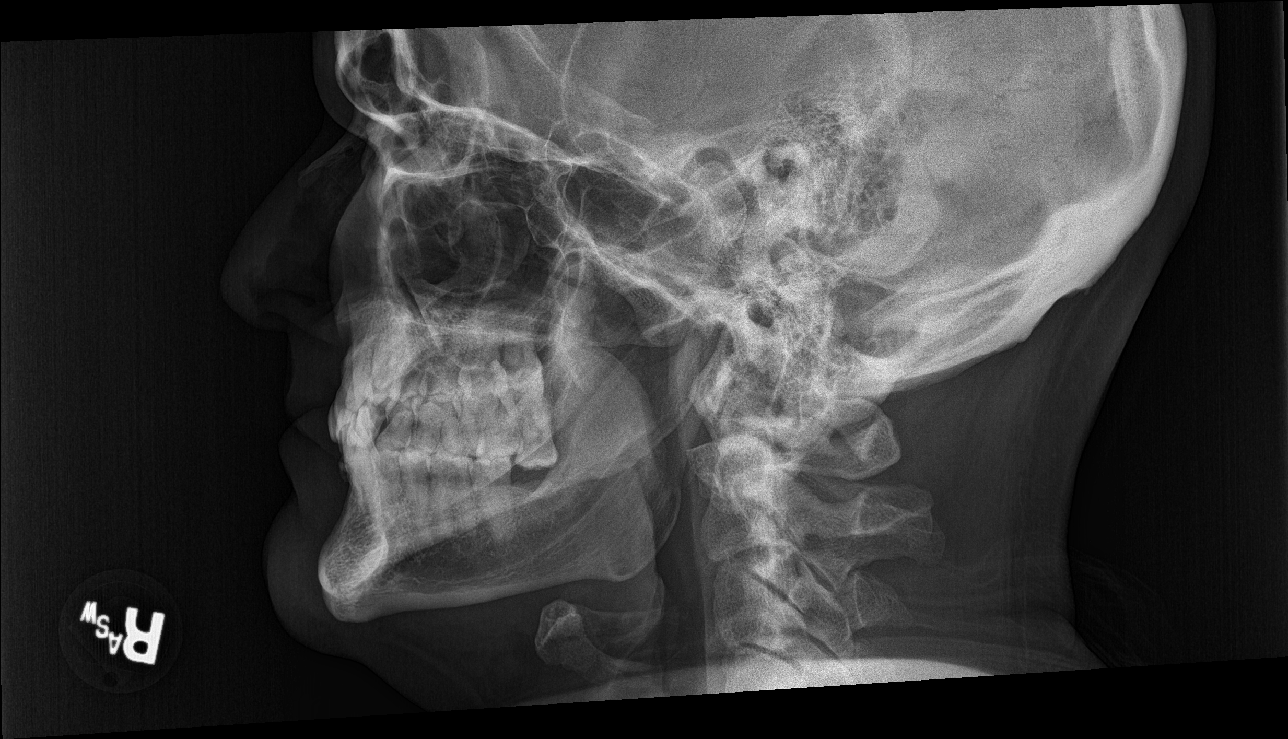

[mandible obl (2 of 2)]
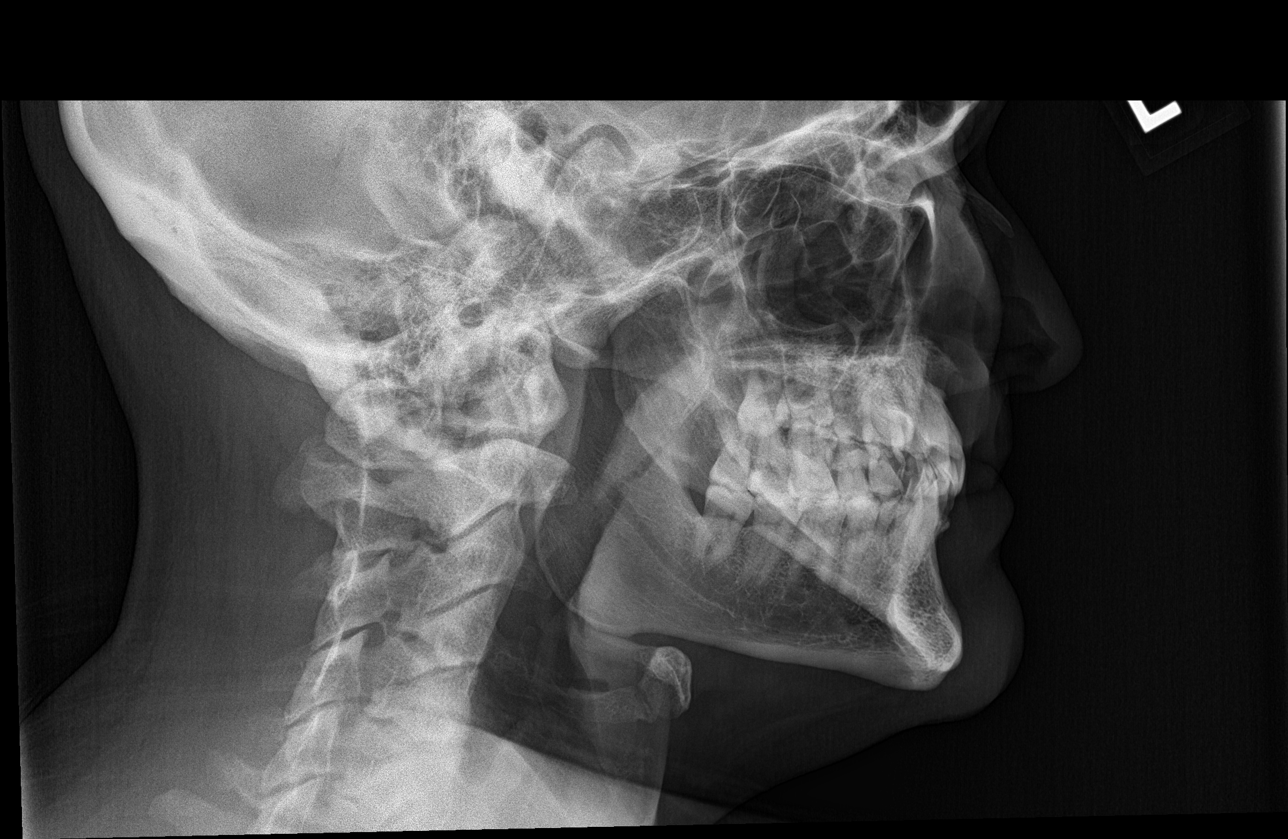

[mandible pa (2 of 2)]
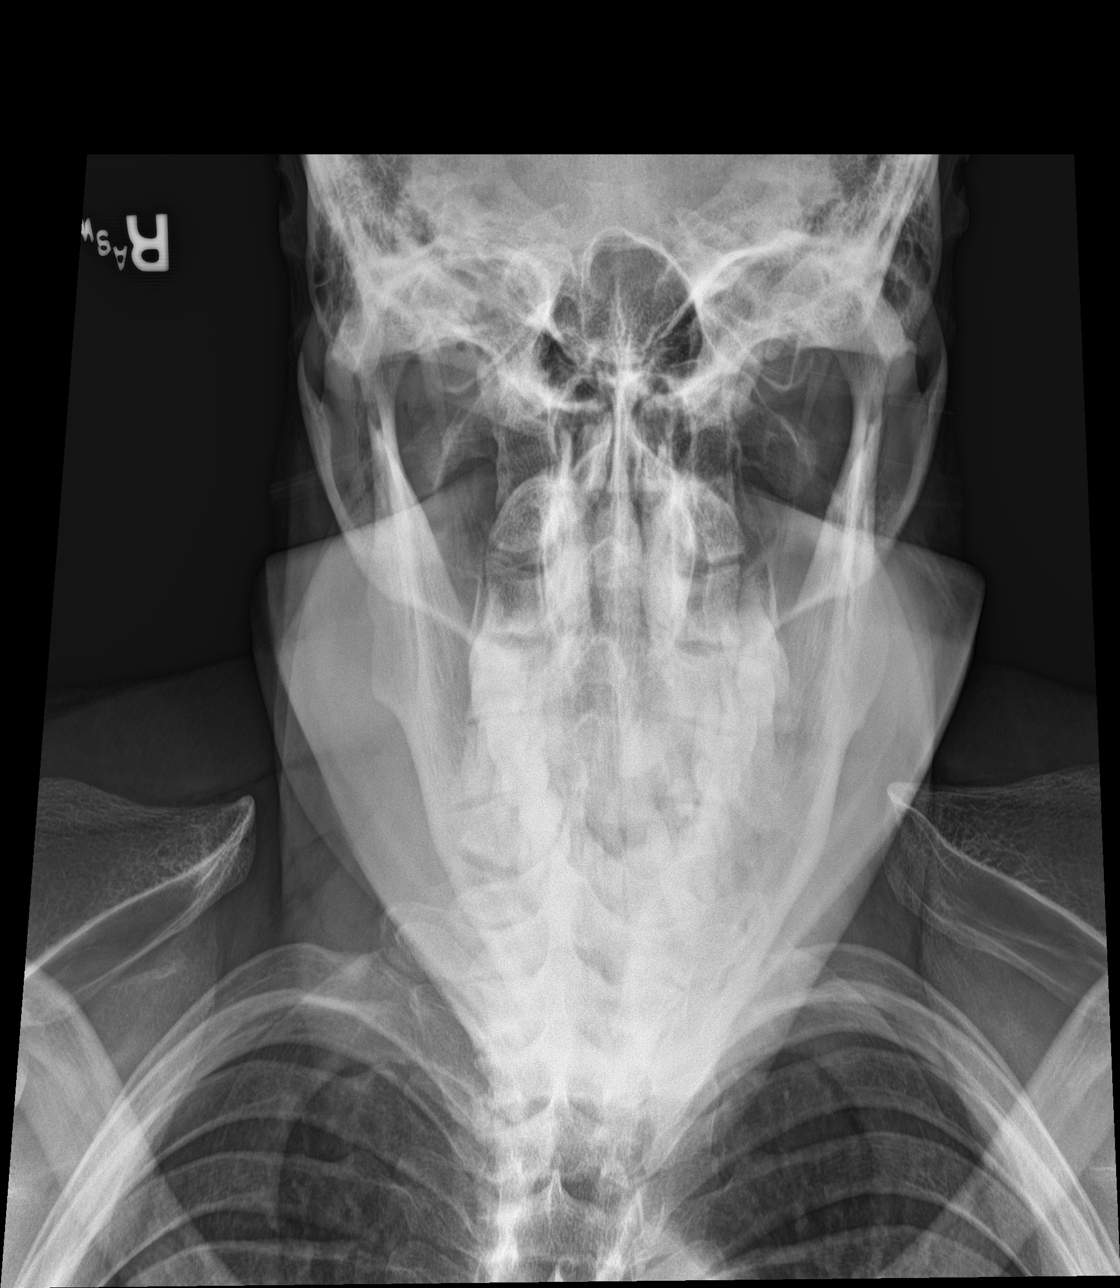

[4 of 4 positions shown; findings below may reference images not displayed]

FINDINGS: Bone mineralization is within normal limits. The mandible appears
intact. Bilateral paranasal sinus and mastoid pneumatization appears
within normal limits. No osseous abnormality identified.
IMPRESSION: Negative radiographic appearance of the mandible.

Normal, symmetric appearing paranasal sinus and mastoid
pneumatization.

## 2020-06-13 ENCOUNTER — Ambulatory Visit
Admission: RE | Admit: 2020-06-13 | Discharge: 2020-06-13 | Disposition: A | Discharge status: Home / Self Care | Payer: 59 | Source: Ambulatory Visit | Attending: Physician Assistant | Admitting: Physician Assistant

## 2020-06-13 ENCOUNTER — Other Ambulatory Visit: Payer: Self-pay

## 2020-06-13 VITALS — BP 150/100 | HR 81 | Temp 98.5°F | Resp 18 | Ht 71.0 in | Wt 175.0 lb

## 2020-06-13 DIAGNOSIS — J019 Acute sinusitis, unspecified: Secondary | ICD-10-CM | POA: Diagnosis not present

## 2020-06-13 DIAGNOSIS — Z20822 Contact with and (suspected) exposure to covid-19: Secondary | ICD-10-CM | POA: Insufficient documentation

## 2020-06-13 DIAGNOSIS — H9201 Otalgia, right ear: Secondary | ICD-10-CM

## 2020-06-13 DIAGNOSIS — I10 Essential (primary) hypertension: Secondary | ICD-10-CM | POA: Diagnosis not present

## 2020-06-13 DIAGNOSIS — R609 Edema, unspecified: Secondary | ICD-10-CM

## 2020-06-13 DIAGNOSIS — Z79899 Other long term (current) drug therapy: Secondary | ICD-10-CM | POA: Insufficient documentation

## 2020-06-13 DIAGNOSIS — Z8249 Family history of ischemic heart disease and other diseases of the circulatory system: Secondary | ICD-10-CM | POA: Diagnosis not present

## 2020-06-13 LAB — SARS CORONAVIRUS 2 (TAT 6-24 HRS): SARS Coronavirus 2: NEGATIVE

## 2020-06-13 MED ORDER — AMOXICILLIN-POT CLAVULANATE 875-125 MG PO TABS: 1.0000 | ORAL_TABLET | Freq: Two times a day (BID) | ORAL | 0 refills | Status: AC

## 2020-06-13 NOTE — ED Provider Notes (Signed)
[facility]    CSN: [id number] Arrival date & time: 06/13/20  1045      History   Chief Complaint Chief Complaint  Patient presents with  . Appointment  . Nasal Congestion  . Ear Fullness  . Foot Swelling    HPI [name] [name] is a 49 y.o. male presenting for sinus and nasal congestion over the last week to week and a half.  He says over the past 3 days he has developed right sided headache and pain into his jaw as well as ear pain.  Patient believes he may have a sinus infection as he has had them before and says symptoms are similar.  He denies any associated fever, some cough or sore throat.  No known Covid exposure.  He was recently on a cruise and everyone was vaccinated.  He had a Covid test prior to being on the cruise which was negative.  Cruise was 1 week.  Patient has occasionally taken over-the-counter decongestants.  Patient additionally mentions that he has swelling of the ankles that comes and goes over the past couple of years.  He says it seems to be a little worse over the past month.  He says the swelling is worse when he stands or walks for a long period of time.  Swelling improved swelling puts his feet up at night.  Denies any associated pain.  He says that he does feel like to move the toes on his right foot are numb or are "wrapped in gauze.".  Denies any back pain.  Has not mentioned this to his PCP in the past.  No known renal or cardiac issues.  No known venous disease.  No other concerns today.  HPI  Past Medical History:  Diagnosis Date  . Degenerative disc disease, lumbar   . Headache    sinus  . Hypertension     Patient Active Problem List   Diagnosis Date Noted  . Epistaxis 07/12/2016  . Severe epistaxis 07/12/2016    Past Surgical History:  Procedure Laterality Date  . ENDOSCOPIC CONCHA BULLOSA RESECTION Right 07/12/2016   Procedure: ENDOSCOPIC CONCHA BULLOSA RESECTION;  Surgeon: Vernie Murders, MD;  Location: [facility];  Service: ENT;  Laterality: Right;  . HIP SURGERY     laprascopic   . NASAL ENDOSCOPY WITH EPISTAXIS CONTROL Bilateral 07/12/2016   Procedure: NASAL ENDOSCOPY WITH EPISTAXIS CONTROL;  Surgeon: Vernie Murders, MD;  Location: [facility];  Service: ENT;  Laterality: Bilateral;  . SEPTOPLASTY N/A 07/12/2016   Procedure: SEPTOPLASTY;  Surgeon: Vernie Murders, MD;  Location: [facility];  Service: ENT;  Laterality: N/A;  . TENDON REPAIR Right    cut tendon repaired in R thumb  . TURBINATE REDUCTION Left 07/12/2016   Procedure: INFERIOR TURBINATE REDUCTION;  Surgeon: Vernie Murders, MD;  Location: [facility];  Service: ENT;  Laterality: Left;  Marland Kitchen VASECTOMY         Home Medications    Prior to Admission medications   Medication Sig Start Date End Date Taking? Authorizing Provider  alfuzosin (UROXATRAL) 10 MG 24 hr tablet Take 10 mg by mouth at bedtime.    Yes [provider]  amLODipine (NORVASC) 5 MG tablet Take 5 mg by mouth daily.   Yes [provider]  sildenafil (REVATIO) 20 MG tablet Take 20 mg by mouth 1 day or 1 dose.   Yes [provider]  traZODone (DESYREL) 50 MG tablet Take 50 mg by mouth at  bedtime.   Yes [provider]  amoxicillin-clavulanate (AUGMENTIN) 875-125 MG tablet Take 1 tablet by mouth every 12 (twelve) hours for 5 days. 06/13/20 06/18/20  Eusebio Friendly B, PA-C  lisinopril (PRINIVIL,ZESTRIL) 10 MG tablet Take 10 mg by mouth daily.    [provider]  methylPREDNISolone (MEDROL DOSEPAK) 4 MG TBPK tablet Take per package instructions 09/17/17   Lutricia Feil, PA-C  tiZANidine (ZANAFLEX) 4 MG tablet Take 4 mg by mouth 3 (three) times daily. 05/20/20   [provider]    Family History Family History  Problem Relation Age of Onset  . Hypertension Mother   . Hypertension Father     Social History Social History   Tobacco Use  . Smoking status: Never Smoker  . Smokeless tobacco: Never  Used  Vaping Use  . Vaping Use: Never used  Substance Use Topics  . Alcohol use: Yes    Alcohol/week: 14.0 standard drinks    Types: 14 Cans of beer per week  . Drug use: No     Allergies   Coconut oil   Review of Systems Review of Systems  Constitutional: Negative for fatigue and fever.  HENT: Positive for congestion, ear pain, rhinorrhea, sinus pressure and sinus pain. Negative for sore throat.   Respiratory: Negative for cough and shortness of breath.   Cardiovascular:       Swelling of ankles  Gastrointestinal: Negative for abdominal pain, diarrhea, nausea and vomiting.  Musculoskeletal: Negative for back pain and myalgias.  Neurological: Positive for numbness. Negative for weakness, light-headedness and headaches.  Hematological: Negative for adenopathy.     Physical Exam Triage Vital Signs ED Triage Vitals  Enc Vitals Group     BP 06/13/20 1141 (!) 150/100     Pulse Rate 06/13/20 1141 81     Resp 06/13/20 1141 18     Temp 06/13/20 1141 98.5 F (36.9 C)     Temp Source 06/13/20 1141 Oral     SpO2 06/13/20 1141 99 %     Weight 06/13/20 1139 175 lb 0.7 oz (79.4 kg)     Height 06/13/20 1139 5\' 11"  (1.803 m)     Head Circumference --      Peak Flow --      Pain Score 06/13/20 1137 4     Pain Loc --      Pain Edu? --      Excl. in GC? --    No data found.  Updated Vital Signs BP (!) 150/100 (BP Location: Right Arm)   Pulse 81   Temp 98.5 F (36.9 C) (Oral)   Resp 18   Ht 5\' 11"  (1.803 m)   Wt 175 lb 0.7 oz (79.4 kg)   SpO2 99%   BMI 24.41 kg/m    Physical Exam Vitals and nursing note reviewed.  Constitutional:      General: He is not in acute distress.    Appearance: Normal appearance. He is well-developed and normal weight. He is not ill-appearing or toxic-appearing.  HENT:     Head: Normocephalic and atraumatic.     Right Ear: A middle ear effusion is present. Tympanic membrane is injected.     Left Ear: A middle ear effusion is present.      Nose: Congestion present.     Right Sinus: Maxillary sinus tenderness present.     Mouth/Throat:     Mouth: Mucous membranes are moist.     Pharynx: Oropharynx is clear. No posterior oropharyngeal erythema.  Eyes:     Conjunctiva/sclera: Conjunctivae normal.  Cardiovascular:     Rate and Rhythm: Normal rate and regular rhythm.     Heart sounds: Normal heart sounds.  Pulmonary:     Effort: Pulmonary effort is normal. No respiratory distress.     Breath sounds: Normal breath sounds.  Musculoskeletal:     Cervical back: Neck supple.     Right lower leg: No edema.     Left lower leg: No edema.     Comments: No edema noted.  No tenderness of any part of the foot or ankle.  No tenderness of back.  Full range of motion of all extremities as well as the back.  Normal sensation of the feet and toes to light touch.  Good pulses and strength.  Lymphadenopathy:     Cervical: No cervical adenopathy.  Skin:    General: Skin is warm and dry.  Neurological:     General: No focal deficit present.     Mental Status: He is alert. Mental status is at baseline.     Motor: No weakness.     Gait: Gait normal.  Psychiatric:        Mood and Affect: Mood normal.        Behavior: Behavior normal.        Thought Content: Thought content normal.      UC Treatments / Results  Labs (all labs ordered are listed, but only abnormal results are displayed) Labs Reviewed  SARS CORONAVIRUS 2 (TAT 6-24 HRS)    EKG   Radiology No results found.  Procedures Procedures (including critical care time)  Medications Ordered in UC Medications - No data to display  Initial Impression / Assessment and Plan / UC Course  I have reviewed the triage vital signs and the nursing notes.  Pertinent labs & imaging results that were available during my care of the patient were reviewed by me and considered in my medical decision making (see chart for details).   Treating patient at this time for possible secondary  bacterial sinusitis with Augmentin.  Advised him to take over-the-counter decongestants and increase his fluid intake.  Advised him to follow-up as needed for department if not feeling better over the next 7 to 10 days.  On exam I did not note any peripheral edema.  Discussed the multiple causes for peripheral edema including vascular issues, cardiac and renal issues, and is dependent edema from not elevating feet advised patient to follow-up with his PCP about this to have further work-up if it continues.  Discussed elevating feet and consider use of compression hose if he is going to be standing and walking a lot during the day.  Patient agreeable.  Final Clinical Impressions(s) / UC Diagnoses   Final diagnoses:  Acute sinusitis, recurrence not specified, unspecified location  Right ear pain  Peripheral edema   Discharge Instructions   None    ED Prescriptions    Medication Sig Dispense Auth. Provider   amoxicillin-clavulanate (AUGMENTIN) 875-125 MG tablet Take 1 tablet by mouth every 12 (twelve) hours for 5 days. 10 tablet Gareth Morgan     PDMP not reviewed this encounter.   Shirlee Latch, PA-C 06/14/20 [phone]

## 2020-06-13 NOTE — ED Triage Notes (Addendum)
Patient c/o nasal congestion and ear fullness that started 3-4 days ago. Patient just returned from a 7 day cruise. He does states everyone on the cruise was vaccinated.  He also reports ankle swelling for about 1 month and on the right foot 2 of his toes feel numb.

## 2021-11-08 ENCOUNTER — Other Ambulatory Visit: Payer: Self-pay

## 2021-11-08 ENCOUNTER — Ambulatory Visit
Admission: EM | Admit: 2021-11-08 | Discharge: 2021-11-08 | Disposition: A | Discharge status: Home / Self Care | Payer: 59 | Attending: Physician Assistant | Admitting: Physician Assistant

## 2021-11-08 DIAGNOSIS — R079 Chest pain, unspecified: Secondary | ICD-10-CM

## 2021-11-08 DIAGNOSIS — M5412 Radiculopathy, cervical region: Secondary | ICD-10-CM

## 2021-11-08 DIAGNOSIS — M549 Dorsalgia, unspecified: Secondary | ICD-10-CM | POA: Diagnosis not present

## 2021-11-08 MED ORDER — PREDNISONE 10 MG PO TABS: ORAL_TABLET | ORAL | 0 refills | Status: DC

## 2021-11-08 MED ORDER — HYDROCODONE-ACETAMINOPHEN 7.5-325 MG PO TABS: 1.0000 | ORAL_TABLET | Freq: Two times a day (BID) | ORAL | 0 refills | Status: AC | PRN

## 2021-11-08 MED ORDER — HYDROCODONE-ACETAMINOPHEN 5-325 MG PO TABS: 2.0000 | ORAL_TABLET | Freq: Two times a day (BID) | ORAL | 0 refills | Status: DC | PRN

## 2021-11-08 NOTE — ED Triage Notes (Signed)
Pt states 3 weeks of muscle pain to his left upper back and shoulder and now in his armpit. Hurts worse with certain positions ?

## 2021-11-08 NOTE — ED Notes (Signed)
EKG performed and shown to [name] Masse, PA-C ?

## 2021-11-08 NOTE — Discharge Instructions (Signed)
Your EKG looks good.  I believe you have a pinched nerve in your neck.  As we discussed the versus trying anti-inflammatory medications and corticosteroids.  We will try on a prednisone taper.  Continue your tizanidine at bedtime.  Look into getting physical therapy.  A lot of places that require you to have a referral.  If you continue to have discomfort after you finish the prednisone has not improved over the next 2 weeks or so then he should follow-up with orthopedics.  They may want to get an MRI of your neck to see if you in fact have a pinched nerve, bulging disc, arthritis in your neck causing the symptoms.  Can take Tylenol for pain as well if needed. ? ?NECK PAIN: Stressed avoiding painful activities. This can exacerbate your symptoms and make them worse.  May apply heat to the areas of pain for some relief. Use medications as directed. Be aware of which medications make you drowsy and do not drive or operate any kind of heavy machinery while using the medication (ie pain medications or muscle relaxers). F/U with PCP for reexamination or return sooner if condition worsens or does not begin to improve over the next few days.  ? ?NECK PAIN RED FLAGS: If symptoms get worse than they are right now, you should come back sooner for re-evaluation. If you have increased numbness/ tingling or notice that the numbness/tingling is affecting the legs or saddle region, go to ER. If you ever lose continence go to ER.     ?

## 2021-11-08 NOTE — ED Provider Notes (Signed)
[facility]    CSN: [id number] Arrival date & time: 11/08/21  1535      History   Chief Complaint Chief Complaint  Patient presents with   Back Pain    HPI [name] [name] is a 51 y.o. male presenting for 3-week history of constant, aching neck pain, left posterior scapular pain/left upper back pain, left pectoralis pain, left axillary pain, left biceps pain.  Patient reports pain does not go beyond elbow on the left side.  It is not associated with palpitations, dizziness, shortness of breath, numbness/tingling or weakness.  Pain is elicited/exacerbated when patient rotates his neck to the left and looks up.  He says every time he does this it causes pain in all of these areas.  Patient has taken diclofenac and says it has helped.  He denies any injury.   Past medical history significant for hypertension and degenerative disc disease.  Patient denies any history of cardiovascular disease.  He has no other complaints.  HPI  Past Medical History:  Diagnosis Date   Degenerative disc disease, lumbar    Headache    sinus   Hypertension     Patient Active Problem List   Diagnosis Date Noted   Epistaxis 07/12/2016   Severe epistaxis 07/12/2016    Past Surgical History:  Procedure Laterality Date   ENDOSCOPIC CONCHA BULLOSA RESECTION Right 07/12/2016   Procedure: ENDOSCOPIC CONCHA BULLOSA RESECTION;  Surgeon: Margaretha Sheffield, MD;  Location: [place];  Service: ENT;  Laterality: Right;   HIP SURGERY     laprascopic    NASAL ENDOSCOPY WITH EPISTAXIS CONTROL Bilateral 07/12/2016   Procedure: NASAL ENDOSCOPY WITH EPISTAXIS CONTROL;  Surgeon: Margaretha Sheffield, MD;  Location: [place];  Service: ENT;  Laterality: Bilateral;   SEPTOPLASTY N/A 07/12/2016   Procedure: SEPTOPLASTY;  Surgeon: Margaretha Sheffield, MD;  Location: [place];  Service: ENT;  Laterality: N/A;   TENDON REPAIR Right    cut tendon repaired in R thumb   TURBINATE REDUCTION Left  07/12/2016   Procedure: INFERIOR TURBINATE REDUCTION;  Surgeon: Margaretha Sheffield, MD;  Location: [place];  Service: ENT;  Laterality: Left;   VASECTOMY         Home Medications    Prior to Admission medications   Medication Sig Start Date End Date Taking? Authorizing Provider  HYDROcodone-acetaminophen (NORCO/VICODIN) 5-325 MG tablet Take 2 tablets by mouth every 12 (twelve) hours as needed for up to 3 days. 11/08/21 11/11/21 Yes Danton Clap, PA-C  predniSONE (DELTASONE) 10 MG tablet Take 6 tabs p.o. on day 1 and decrease by 1 tablet daily until complete 11/08/21  Yes Danton Clap, PA-C  alfuzosin (UROXATRAL) 10 MG 24 hr tablet Take 10 mg by mouth at bedtime.     [provider]  amLODipine (NORVASC) 5 MG tablet Take 5 mg by mouth daily.    [provider]  lisinopril (PRINIVIL,ZESTRIL) 10 MG tablet Take 10 mg by mouth daily.    [provider]  sildenafil (REVATIO) 20 MG tablet Take 20 mg by mouth 1 day or 1 dose.    [provider]  tiZANidine (ZANAFLEX) 4 MG tablet Take 4 mg by mouth 3 (three) times daily. 05/20/20   [provider]  traZODone (DESYREL) 50 MG tablet Take 50 mg by mouth at bedtime.    [provider]    Family History Family History  Problem Relation Age of Onset   Hypertension Mother    Hypertension  Father     Social History Social History   Tobacco Use   Smoking status: Never   Smokeless tobacco: Never  Vaping Use   Vaping Use: Never used  Substance Use Topics   Alcohol use: Yes    Alcohol/week: 14.0 standard drinks    Types: 14 Cans of beer per week   Drug use: No     Allergies   Coconut oil and Sodium hypochlorite   Review of Systems Review of Systems  Constitutional:  Negative for fatigue and fever.  Respiratory:  Negative for cough, chest tightness and shortness of breath.   Cardiovascular:  Positive for chest pain (Left pectoralis, left axillary, left biceps). Negative for  palpitations.  Gastrointestinal:  Negative for abdominal pain, nausea and vomiting.  Genitourinary:  Negative for flank pain.  Musculoskeletal:  Positive for back pain (left post scapula/left thoracic back), neck pain and neck stiffness.  Neurological:  Negative for dizziness, weakness and numbness.    Physical Exam Triage Vital Signs ED Triage Vitals  Enc Vitals Group     BP 11/08/21 1555 (!) 155/96     Pulse Rate 11/08/21 1555 69     Resp 11/08/21 1555 16     Temp 11/08/21 1555 98.7 F (37.1 C)     Temp Source 11/08/21 1555 Oral     SpO2 11/08/21 1555 100 %     Weight 11/08/21 1551 192 lb (87.1 kg)     Height 11/08/21 1551 6' (1.829 m)     Head Circumference --      Peak Flow --      Pain Score 11/08/21 1551 5     Pain Loc --      Pain Edu? --      Excl. in GC? --    No data found.  Updated Vital Signs BP (!) 155/96 (BP Location: Left Arm)    Pulse 69    Temp 98.7 F (37.1 C) (Oral)    Resp 16    Ht 6' (1.829 m)    Wt 192 lb (87.1 kg)    SpO2 100%    BMI 26.04 kg/m      Physical Exam Vitals and nursing note reviewed.  Constitutional:      General: He is not in acute distress.    Appearance: Normal appearance. He is well-developed. He is not ill-appearing.  HENT:     Head: Normocephalic and atraumatic.  Eyes:     General: No scleral icterus.    Conjunctiva/sclera: Conjunctivae normal.  Cardiovascular:     Rate and Rhythm: Normal rate and regular rhythm.     Heart sounds: Normal heart sounds.  Pulmonary:     Effort: Pulmonary effort is normal. No respiratory distress.     Breath sounds: Normal breath sounds.  Chest:     Chest wall: No tenderness.  Musculoskeletal:        General: No swelling.     Left shoulder: Normal.     Cervical back: Neck supple. No tenderness or bony tenderness. Pain with movement (increased pain in all directions, worst with rotating to left and looking up) present. Decreased range of motion.     Thoracic back: No tenderness or bony  tenderness.  Skin:    General: Skin is warm and dry.     Capillary Refill: Capillary refill takes less than 2 seconds.  Neurological:     General: No focal deficit present.     Mental Status: He is alert and oriented to person,  place, and time. Mental status is at baseline.     Motor: No weakness.     Coordination: Coordination normal.     Gait: Gait normal.  Psychiatric:        Mood and Affect: Mood normal.        Behavior: Behavior normal.        Thought Content: Thought content normal.     UC Treatments / Results  Labs (all labs ordered are listed, but only abnormal results are displayed) Labs Reviewed - No data to display  EKG   Radiology No results found.  Procedures ED EKG  Date/Time: 11/08/2021 4:41 PM Performed by: Danton Clap, PA-C Authorized by: Danton Clap, PA-C   ECG reviewed by ED Physician in the absence of a cardiologist: yes   Previous ECG:    Previous ECG:  Unavailable Interpretation:    Interpretation: normal     Details:  Incomplete RBBB Rate:    ECG rate:  66 Rhythm:    Rhythm: sinus rhythm   Ectopy:    Ectopy: none   QRS:    QRS axis:  Normal   QRS intervals:  Normal   QRS conduction: RBBB   T waves:    T waves: normal   Comments:     NSR, incomplete RBBB (including critical care time)  Medications Ordered in UC Medications - No data to display  Initial Impression / Assessment and Plan / UC Course  I have reviewed the triage vital signs and the nursing notes.  Pertinent labs & imaging results that were available during my care of the patient were reviewed by me and considered in my medical decision making (see chart for details).  51 year old male presenting for neck pain, left upper back/left posterior scapular pain, left pectoralis and axillary pain as well as left biceps pain.  No injury.  Pain is improved with diclofenac.  Pain is worsened with rotating neck to left and looking up.  Not associate with numbness or tingling.   No dizziness, weakness, shortness of breath, sweats, palpitations, syncope or presyncope.  EKG performed today shows normal sinus rhythm and incomplete right bundle branch block.  Cannot find any old EKGs to compare to.  Discussed results of EKG with patient.  EKG is reassuring.  Patient's symptoms seem to be most consistent with likely a pinched nerve in the neck/cervical radiculopathy.  I discussed this with patient.  Appears to be about level C6-C7.  We will try prednisone Dosepak since the diclofenac seem to help a little.  Hopefully the corticosteroids will help a lot.  Patient takes tizanidine at nighttime.  Advised him to continue with that as needed.  Reviewed controlled substance database and find patient to be low risk for abuse.  Prescribed very short supply of Norco as needed for severe pain at night.  Encouraged him to follow-up with orthopedics if not better in the next 2 weeks as he may need an MRI of his neck.  Thoroughly reviewed ED precautions with patient relating his neck pain and also to chest pain but given his HPI, very low suspicion for cardiovascular condition causing the symptoms however I did discuss the importance of going to the ED if the pain in his chest or any other pain worsens or is associated with dizziness, numbness/tingling, weakness, feeling faint or passing out, palpitations, shortness of breath, weakness.   Final Clinical Impressions(s) / UC Diagnoses   Final diagnoses:  Cervical radiculopathy  Upper back pain on left side  Chest pain, unspecified type     Discharge Instructions      Your EKG looks good.  I believe you have a pinched nerve in your neck.  As we discussed the versus trying anti-inflammatory medications and corticosteroids.  We will try on a prednisone taper.  Continue your tizanidine at bedtime.  Look into getting physical therapy.  A lot of places that require you to have a referral.  If you continue to have discomfort after you finish the  prednisone has not improved over the next 2 weeks or so then he should follow-up with orthopedics.  They may want to get an MRI of your neck to see if you in fact have a pinched nerve, bulging disc, arthritis in your neck causing the symptoms.  Can take Tylenol for pain as well if needed.  NECK PAIN: Stressed avoiding painful activities. This can exacerbate your symptoms and make them worse.  May apply heat to the areas of pain for some relief. Use medications as directed. Be aware of which medications make you drowsy and do not drive or operate any kind of heavy machinery while using the medication (ie pain medications or muscle relaxers). F/U with PCP for reexamination or return sooner if condition worsens or does not begin to improve over the next few days.   NECK PAIN RED FLAGS: If symptoms get worse than they are right now, you should come back sooner for re-evaluation. If you have increased numbness/ tingling or notice that the numbness/tingling is affecting the legs or saddle region, go to ER. If you ever lose continence go to ER.         ED Prescriptions     Medication Sig Dispense Auth. Provider   predniSONE (DELTASONE) 10 MG tablet Take 6 tabs p.o. on day 1 and decrease by 1 tablet daily until complete 21 tablet Laurene Footman B, PA-C   HYDROcodone-acetaminophen (NORCO/VICODIN) 5-325 MG tablet Take 2 tablets by mouth every 12 (twelve) hours as needed for up to 3 days. 6 tablet Danton Clap, PA-C      I have reviewed the PDMP during this encounter.   Laurene Footman B, PA-C 11/08/21 1709

## 2021-11-09 ENCOUNTER — Ambulatory Visit: Payer: 59

## 2022-06-07 ENCOUNTER — Ambulatory Visit
Admission: RE | Admit: 2022-06-07 | Discharge: 2022-06-07 | Disposition: A | Discharge status: Home / Self Care | Payer: 59 | Source: Ambulatory Visit

## 2022-06-07 ENCOUNTER — Other Ambulatory Visit: Payer: Self-pay

## 2022-06-07 VITALS — BP 143/101 | HR 72 | Temp 98.7°F | Resp 16 | Ht 71.0 in | Wt 204.0 lb

## 2022-06-07 DIAGNOSIS — Z1152 Encounter for screening for COVID-19: Secondary | ICD-10-CM

## 2022-06-07 DIAGNOSIS — H9203 Otalgia, bilateral: Secondary | ICD-10-CM

## 2022-06-07 DIAGNOSIS — U071 COVID-19: Secondary | ICD-10-CM | POA: Diagnosis not present

## 2022-06-07 DIAGNOSIS — R051 Acute cough: Secondary | ICD-10-CM

## 2022-06-07 DIAGNOSIS — J069 Acute upper respiratory infection, unspecified: Secondary | ICD-10-CM

## 2022-06-07 DIAGNOSIS — R0981 Nasal congestion: Secondary | ICD-10-CM

## 2022-06-07 LAB — SARS CORONAVIRUS 2 BY RT PCR: SARS Coronavirus 2 by RT PCR: POSITIVE — AB

## 2022-06-07 MED ORDER — IPRATROPIUM BROMIDE 0.06 % NA SOLN: 2.0000 | Freq: Four times a day (QID) | NASAL | 0 refills | Status: DC

## 2022-06-07 NOTE — ED Triage Notes (Signed)
Patient went on a cruise to [place], [place], and [place]. Having sinus pressure, ear pain, and nasal drainage. States the congestion is starting to settle in his chest, having dizziness when getting up to move around.   No one that went with the Patient is sick.   Patient has history of sinus issues but corrected with deviated septum surgery. No respiratory medical history.

## 2022-06-07 NOTE — Discharge Instructions (Addendum)
-  We will call if COVID is + and you would need to isolate 5 days from Sunday and wear a mask x 5 more days.  URI/COLD SYMPTOMS: Your exam today is consistent with a viral illness. Antibiotics are not indicated at this time. Use medications as directed, including cough syrup, nasal saline, and decongestants. Your symptoms should improve over the next few days and resolve within another 7-10 days. Increase rest and fluids. F/u if symptoms worsen or predominate such as sore throat, ear pain, productive cough, shortness of breath, or if you develop high fevers or worsening fatigue over the next several days.

## 2022-06-07 NOTE — ED Provider Notes (Signed)
[facility]    CSN: [id number] Arrival date & time: 06/07/22  1142      History   Chief Complaint Chief Complaint  Patient presents with   Ear Fullness    I believe I may have a sinus/ear infection. - Entered by patient   Cough   Nasal Congestion    HPI [name] [name] is a 51 y.o. male presenting for 4 to 5-day history of bilateral ear pain/pressure, sinus pressure, nasal drainage/congestion and cough.  Patient reports that whenever he moves around he feels a bit dizzy.  He reports that he recently returned from a cruise and went to multiple different countries.  Patient says his symptoms of gotten a little better from onset but he wanted to be checked for the possibility of a sinus infection or something contagious.  He has a history of sinus infections but has not had any problems since his sinus surgery in 2017.  Patient has been taking Sudafed for symptoms.  Denies any sick contacts.  Patient is not concerned about COVID.  Says symptoms currently do not feel like when he had COVID in the past.  No other complaints.  HPI  Past Medical History:  Diagnosis Date   Degenerative disc disease, lumbar    Headache    sinus   Hypertension     Patient Active Problem List   Diagnosis Date Noted   Epistaxis 07/12/2016   Severe epistaxis 07/12/2016    Past Surgical History:  Procedure Laterality Date   ENDOSCOPIC CONCHA BULLOSA RESECTION Right 07/12/2016   Procedure: ENDOSCOPIC CONCHA BULLOSA RESECTION;  Surgeon: Vernie Murders, MD;  Location: [facility];  Service: ENT;  Laterality: Right;   HIP SURGERY     laprascopic    NASAL ENDOSCOPY WITH EPISTAXIS CONTROL Bilateral 07/12/2016   Procedure: NASAL ENDOSCOPY WITH EPISTAXIS CONTROL;  Surgeon: Vernie Murders, MD;  Location: [facility];  Service: ENT;  Laterality: Bilateral;   SEPTOPLASTY N/A 07/12/2016   Procedure: SEPTOPLASTY;  Surgeon: Vernie Murders, MD;  Location: [facility];  Service: ENT;   Laterality: N/A;   TENDON REPAIR Right    cut tendon repaired in R thumb   TURBINATE REDUCTION Left 07/12/2016   Procedure: INFERIOR TURBINATE REDUCTION;  Surgeon: Vernie Murders, MD;  Location: [facility];  Service: ENT;  Laterality: Left;   VASECTOMY         Home Medications    Prior to Admission medications   Medication Sig Start Date End Date Taking? Authorizing Provider  alfuzosin (UROXATRAL) 10 MG 24 hr tablet Take 10 mg by mouth at bedtime.    Yes [provider]  amLODipine (NORVASC) 5 MG tablet Take 5 mg by mouth daily.   Yes [provider]  gabapentin (NEURONTIN) 300 MG capsule TAKE 1 CAPSULE BY MOUTH TWICE A DAY FOR 30 DAYS 03/22/22  Yes [provider]  ipratropium (ATROVENT) 0.06 % nasal spray Place 2 sprays into both nostrils 4 (four) times daily. 06/07/22  Yes Eusebio Friendly B, PA-C  sildenafil (REVATIO) 20 MG tablet Take 20 mg by mouth 1 day or 1 dose.   Yes [provider]  terbinafine (LAMISIL) 250 MG tablet Take 250 mg by mouth daily. 05/20/22  Yes [provider]  tiZANidine (ZANAFLEX) 4 MG tablet Take 4 mg by mouth 3 (three) times daily. 05/20/20  Yes [provider]  traZODone (DESYREL) 50 MG tablet Take 50 mg by mouth at bedtime.   Yes [provider]  lisinopril (PRINIVIL,ZESTRIL) 10 MG tablet Take 10 mg by mouth daily.    [provider]  predniSONE (DELTASONE) 10 MG tablet Take 6 tabs p.o. on day 1 and decrease by 1 tablet daily until complete 11/08/21   Danton Clap, PA-C    Family History Family History  Problem Relation Age of Onset   Hypertension Mother    Hypertension Father     Social History Social History   Tobacco Use   Smoking status: Never   Smokeless tobacco: Never  Vaping Use   Vaping Use: Never used  Substance Use Topics   Alcohol use: Yes    Alcohol/week: 14.0 standard drinks of alcohol    Types: 14 Cans of beer per week   Drug use: No     Allergies    Coconut (cocos nucifera) and Sodium hypochlorite   Review of Systems Review of Systems  Constitutional:  Negative for fatigue and fever.  HENT:  Positive for congestion, ear pain, rhinorrhea and sinus pressure. Negative for sinus pain and sore throat.   Respiratory:  Positive for cough. Negative for shortness of breath.   Cardiovascular:  Negative for chest pain.  Gastrointestinal:  Negative for abdominal pain, diarrhea, nausea and vomiting.  Musculoskeletal:  Negative for myalgias.  Neurological:  Positive for dizziness and headaches. Negative for weakness and light-headedness.  Hematological:  Negative for adenopathy.     Physical Exam Triage Vital Signs ED Triage Vitals  Enc Vitals Group     BP 06/07/22 1216 (!) 143/101     Pulse Rate 06/07/22 1216 72     Resp 06/07/22 1216 16     Temp 06/07/22 1216 98.7 F (37.1 C)     Temp Source 06/07/22 1216 Oral     SpO2 06/07/22 1216 97 %     Weight 06/07/22 1220 204 lb (92.5 kg)     Height 06/07/22 1220 5\' 11"  (1.803 m)     Head Circumference --      Peak Flow --      Pain Score 06/07/22 1219 3     Pain Loc --      Pain Edu? --      Excl. in [place]? --    No data found.  Updated Vital Signs BP (!) 143/101 (BP Location: Left Arm)   Pulse 72   Temp 98.7 F (37.1 C) (Oral)   Resp 16   Ht 5\' 11"  (1.803 m)   Wt 204 lb (92.5 kg)   SpO2 97%   BMI 28.45 kg/m      Physical Exam Vitals and nursing note reviewed.  Constitutional:      General: He is not in acute distress.    Appearance: Normal appearance. He is well-developed. He is not ill-appearing.  HENT:     Head: Normocephalic and atraumatic.     Right Ear: Ear canal and external ear normal. A middle ear effusion is present.     Left Ear: Ear canal and external ear normal. A middle ear effusion is present. Tympanic membrane is injected.     Nose: Congestion present.     Mouth/Throat:     Mouth: Mucous membranes are moist.     Pharynx: Oropharynx is clear.  Eyes:      General: No scleral icterus.    Conjunctiva/sclera: Conjunctivae normal.  Cardiovascular:     Rate and Rhythm: Normal rate and regular rhythm.     Heart sounds: No murmur heard. Pulmonary:     Effort: Pulmonary effort  is normal. No respiratory distress.     Breath sounds: Normal breath sounds.  Musculoskeletal:     Cervical back: Neck supple.  Skin:    General: Skin is warm and dry.     Capillary Refill: Capillary refill takes less than 2 seconds.  Neurological:     General: No focal deficit present.     Mental Status: He is alert. Mental status is at baseline.     Motor: No weakness.     Gait: Gait normal.  Psychiatric:        Mood and Affect: Mood normal.        Behavior: Behavior normal.      UC Treatments / Results  Labs (all labs ordered are listed, but only abnormal results are displayed) Labs Reviewed  SARS CORONAVIRUS 2 BY RT PCR    EKG   Radiology No results found.  Procedures Procedures (including critical care time)  Medications Ordered in UC Medications - No data to display  Initial Impression / Assessment and Plan / UC Course  I have reviewed the triage vital signs and the nursing notes.  Pertinent labs & imaging results that were available during my care of the patient were reviewed by me and considered in my medical decision making (see chart for details).   51 year old male presents for 4 to 5-day history of congestion, cough, sinus pressure, ear pain/pressure.  Reports recent international travel.  Vitals are all stable and he is overall well-appearing.  On exam he does have clear effusion of bilateral TMs and injection of the right TM as well as nasal congestion.  His chest is clear auscultation heart regular rate and rhythm.  PCR COVID test performed.  Advised patient we will contact him with any positive results.  Reviewed current CDC guidelines, isolation protocol and ED precautions.  If COVID-negative, suspect another virus.  Supportive  care encouraged with continuing Sudafed.  I sent Atrovent nasal spray as well.  Reviewed that if he is not feeling better after the next 5 to 7 days or if his symptoms acutely worsen he should be reevaluated.  No indication for antibiotics at this time based on his current symptoms and exam.  +COVID.  Reviewed results with patient.  He is out of the window for therapy with antiviral medications at this time.  Supportive care encouraged.  Reiterated the current CDC guidelines, isolation protocol and ED precautions.  Final Clinical Impressions(s) / UC Diagnoses   Final diagnoses:  Viral upper respiratory tract infection  Acute ear pain, bilateral  Nasal congestion  Acute cough  Encounter for screening for COVID-19     Discharge Instructions      -We will call if COVID is + and you would need to isolate 5 days from Sunday and wear a mask x 5 more days.  URI/COLD SYMPTOMS: Your exam today is consistent with a viral illness. Antibiotics are not indicated at this time. Use medications as directed, including cough syrup, nasal saline, and decongestants. Your symptoms should improve over the next few days and resolve within another 7-10 days. Increase rest and fluids. F/u if symptoms worsen or predominate such as sore throat, ear pain, productive cough, shortness of breath, or if you develop high fevers or worsening fatigue over the next several days.       ED Prescriptions     Medication Sig Dispense Auth. Provider   ipratropium (ATROVENT) 0.06 % nasal spray Place 2 sprays into both nostrils 4 (four) times daily. 15 mL  Shirlee Latch, PA-C      PDMP not reviewed this encounter.   Shirlee Latch, PA-C 06/07/22 1354

## 2022-09-18 ENCOUNTER — Ambulatory Visit
Admission: RE | Admit: 2022-09-18 | Discharge: 2022-09-18 | Disposition: A | Discharge status: Home / Self Care | Payer: 59 | Source: Ambulatory Visit | Attending: Physician Assistant | Admitting: Physician Assistant

## 2022-09-18 ENCOUNTER — Other Ambulatory Visit: Payer: Self-pay | Admitting: Physician Assistant

## 2022-09-18 DIAGNOSIS — R109 Unspecified abdominal pain: Secondary | ICD-10-CM

## 2022-09-18 DIAGNOSIS — R1031 Right lower quadrant pain: Secondary | ICD-10-CM

## 2022-09-18 MED ORDER — IOHEXOL 300 MG/ML  SOLN
100.0000 mL | Freq: Once | INTRAMUSCULAR | Status: AC | PRN
  Administered 2022-09-18: 100 mL via INTRAVENOUS

## 2022-12-25 ENCOUNTER — Ambulatory Visit (INDEPENDENT_AMBULATORY_CARE_PROVIDER_SITE_OTHER): Payer: Self-pay | Admitting: Urology

## 2022-12-25 ENCOUNTER — Encounter: Payer: Self-pay | Admitting: Urology

## 2022-12-25 VITALS — BP 136/83 | HR 89 | Ht 71.5 in | Wt 205.6 lb

## 2022-12-25 DIAGNOSIS — N503 Cyst of epididymis: Secondary | ICD-10-CM

## 2022-12-25 DIAGNOSIS — N4340 Spermatocele of epididymis, unspecified: Secondary | ICD-10-CM

## 2022-12-25 NOTE — Patient Instructions (Signed)
Spermatocele  A spermatocele is a fluid-filled sac (cyst) inside the sac that holds the testicles (scrotum). This type of cyst often forms in the epididymis. The epididymis is a coiled tube at the top of each testicle, and this tube is where sperm are stored. The cyst sometimes forms along a tube called the vas deferens, which is a tube that carries sperm away from the epididymis. Spermatoceles are usually painless. Most cysts are small, but they can grow larger. Spermatoceles are not cancerous (are benign). What are the causes? The cause of this condition is not known. However, this condition usually results from a blockage in one of the many small tubes (tubules) that carry sperm from your testicle to your vas deferens. What are the signs or symptoms? In most cases, small cysts do not cause symptoms. However, symptoms sometimes occur. Symptoms of this condition include: Dull pain. A feeling of heaviness. An enlarged scrotum, if your cyst is large. How is this diagnosed? This condition is diagnosed based on a physical exam. You or your health care provider may notice your cyst when feeling your scrotum. Your health care provider may shine a light through (transilluminate) your scrotum to see if light will pass through your cyst. You may have an ultrasound of the scrotum to rule out a tumor. How is this treated? Small spermatoceles do not need to be treated. If your spermatocele has grown large or is uncomfortable, your health care provider may recommend surgery to remove it. Follow these instructions at home: Check your spermatocele regularly for any changes. Do regular self-exams of your scrotum. Keep all follow-up visits. This is important. Contact a health care provider if: Your spermatocele gets larger. You have pain in your scrotum. Your spermatocele comes back after treatment. Get help right away if: You experience severe pain and redness of your scrotum. Summary A spermatocele  is a fluid-filled sac, or a cyst, inside the sac that holds the testicles (scrotum). This condition is usually painless, and it is not cancerous (is benign). Your health care provider may recommend surgery to remove your spermatocele if it grows large or is uncomfortable. If you have a spermatocele, check for any changes and do self-exams of your scrotum. Keep all follow-up visits. This is important. This information is not intended to replace advice given to you by your health care provider. Make sure you discuss any questions you have with your health care provider. Document Revised: 04/10/2021 Document Reviewed: 04/10/2021 Elsevier Patient Education  2023 Elsevier Inc.  

## 2022-12-25 NOTE — Progress Notes (Signed)
   12/25/22 3:47 PM   [name] [name] 02/11/1971 [id number]  CC: Right epididymal cysts  HPI: 52 year old male who reports a few months of noticeable right epididymal lesions that were originally painful but symptoms have since resolved.  Scrotal ultrasound was performed by PCP in February 2024 and showed normal testicles with small right epididymal cyst measuring 1.5 cm.  He denies any urinary symptoms.   PMH: Past Medical History:  Diagnosis Date   Degenerative disc disease, lumbar    Headache    sinus   Hypertension     Surgical History: Past Surgical History:  Procedure Laterality Date   ENDOSCOPIC CONCHA BULLOSA RESECTION Right 07/12/2016   Procedure: ENDOSCOPIC CONCHA BULLOSA RESECTION;  Surgeon: Vernie Murders, MD;  Location: [facility];  Service: ENT;  Laterality: Right;   HIP SURGERY     laprascopic    NASAL ENDOSCOPY WITH EPISTAXIS CONTROL Bilateral 07/12/2016   Procedure: NASAL ENDOSCOPY WITH EPISTAXIS CONTROL;  Surgeon: Vernie Murders, MD;  Location: [facility];  Service: ENT;  Laterality: Bilateral;   SEPTOPLASTY N/A 07/12/2016   Procedure: SEPTOPLASTY;  Surgeon: Vernie Murders, MD;  Location: [facility];  Service: ENT;  Laterality: N/A;   TENDON REPAIR Right    cut tendon repaired in R thumb   TURBINATE REDUCTION Left 07/12/2016   Procedure: INFERIOR TURBINATE REDUCTION;  Surgeon: Vernie Murders, MD;  Location: [facility];  Service: ENT;  Laterality: Left;   VASECTOMY       Family History: Family History  Problem Relation Age of Onset   Hypertension Mother    Hypertension Father     Social History:  reports that he has never smoked. He has never used smokeless tobacco. He reports current alcohol use of about 14.0 standard drinks of alcohol per week. He reports that he does not use drugs.  Physical Exam: BP 136/83 (BP Location: Left Arm, Patient Position: Sitting, Cuff Size: Large)   Pulse 89   Ht 5' 11.5" (1.816 m)   Wt 205  lb 9.6 oz (93.3 kg)   BMI 28.28 kg/m    Constitutional:  Alert and oriented, No acute distress. Cardiovascular: No clubbing, cyanosis, or edema. Respiratory: Normal respiratory effort, no increased work of breathing. GI: Abdomen is soft, nontender, nondistended, no abdominal masses GU: Phallus with patent meatus, no lesions, testicles 20 cc and descended bilaterally without masses, right epididymal cyst measuring 1.5 cm, mildly tender  Assessment & Plan:   52 year old male with an asymptomatic right epididymal cyst.  We discussed this is a benign lesion and no treatment is needed.  I recommended trying NSAIDs, snug fitting underwear, or icing if he has recurrence of his scrotal pain, or we could consider spermatocele ectomy in the future if worsening symptoms.  He would like to avoid surgery at this time.   Legrand Rams, MD 12/25/2022  [facility] [address] [place], [place] [address] [phone]

## 2023-03-18 ENCOUNTER — Other Ambulatory Visit: Payer: Self-pay

## 2023-03-18 ENCOUNTER — Ambulatory Visit
Admission: RE | Admit: 2023-03-18 | Discharge: 2023-03-18 | Disposition: A | Discharge status: Home / Self Care | Payer: 59 | Source: Ambulatory Visit | Attending: Physician Assistant | Admitting: Physician Assistant

## 2023-03-18 VITALS — BP 126/97 | HR 81 | Temp 98.1°F | Resp 18

## 2023-03-18 DIAGNOSIS — K219 Gastro-esophageal reflux disease without esophagitis: Secondary | ICD-10-CM | POA: Diagnosis not present

## 2023-03-18 DIAGNOSIS — R09A2 Foreign body sensation, throat: Secondary | ICD-10-CM | POA: Diagnosis not present

## 2023-03-18 MED ORDER — LIDOCAINE VISCOUS HCL 2 % MT SOLN
15.0000 mL | Freq: Once | OROMUCOSAL | Status: AC
  Administered 2023-03-18: 15 mL via ORAL

## 2023-03-18 MED ORDER — ALUM & MAG HYDROXIDE-SIMETH 200-200-20 MG/5ML PO SUSP
30.0000 mL | Freq: Once | ORAL | Status: AC
  Administered 2023-03-18: 30 mL via ORAL

## 2023-03-18 NOTE — Discharge Instructions (Signed)
-  Discussed, you have something called globus sensation which can be due to many different things.  Common causes is reflux.  We gave you a GI cocktail in the clinic. -Drink at least 1.5 litres (3 pints) of water every day - drink in small sips as swallowing helps to relax the throat -Avoid too much alcohol, tea, coffee and fizzy drinks -Reduce the amount of fatty and spicy food in your diet -Leave at least 3 hours between your last meal and going to bed -Continue anti-reflux medication. Can also add Pepto Bismol -If you develop pain, throat swelling, or unable to swallow anything, have fever, chest tightness, shortness of breath he should go to the ER.  Otherwise, please make a follow-up appoint with your PCP for further workup if this continues.

## 2023-03-18 NOTE — ED Provider Notes (Signed)
[facility]    CSN: [id number] Arrival date & time: 03/18/23  1103      History   Chief Complaint Chief Complaint  Patient presents with   Oral Swelling    HPI [name] [name] is a 52 y.o. male presenting for sensation of lump in throat and difficulty swallowing for the past 3 to 4 days.  Patient denies painful swallowing/sore throat.  He does not have any associated neck swelling, lymph node swelling.  Denies fever, fatigue, cough, congestion, chest pain/tightness.  No facial swelling.  Reports he has a history of acid reflux and has taken Prilosec without any relief for the past 2 days.  He says he does not think it is related to reflux and he thinks that something else.  He does not recall eating any foods that could have gotten stuck in his throat.  He has a history of hypertension, degenerative disc disease of back.  No reported history of thyroid issues, anxiety.  HPI  Past Medical History:  Diagnosis Date   Degenerative disc disease, lumbar    Headache    sinus   Hypertension     Patient Active Problem List   Diagnosis Date Noted   Epistaxis 07/12/2016   Severe epistaxis 07/12/2016    Past Surgical History:  Procedure Laterality Date   ENDOSCOPIC CONCHA BULLOSA RESECTION Right 07/12/2016   Procedure: ENDOSCOPIC CONCHA BULLOSA RESECTION;  Surgeon: Vernie Murders, MD;  Location: [facility];  Service: ENT;  Laterality: Right;   HIP SURGERY     laprascopic    NASAL ENDOSCOPY WITH EPISTAXIS CONTROL Bilateral 07/12/2016   Procedure: NASAL ENDOSCOPY WITH EPISTAXIS CONTROL;  Surgeon: Vernie Murders, MD;  Location: [facility];  Service: ENT;  Laterality: Bilateral;   SEPTOPLASTY N/A 07/12/2016   Procedure: SEPTOPLASTY;  Surgeon: Vernie Murders, MD;  Location: [facility];  Service: ENT;  Laterality: N/A;   TENDON REPAIR Right    cut tendon repaired in R thumb   TURBINATE REDUCTION Left 07/12/2016   Procedure: INFERIOR TURBINATE REDUCTION;   Surgeon: Vernie Murders, MD;  Location: [facility];  Service: ENT;  Laterality: Left;   VASECTOMY         Home Medications    Prior to Admission medications   Medication Sig Start Date End Date Taking? Authorizing Provider  alfuzosin (UROXATRAL) 10 MG 24 hr tablet Take 10 mg by mouth at bedtime.     [provider]  amLODipine (NORVASC) 10 MG tablet Take 10 mg by mouth daily.    [provider]  gabapentin (NEURONTIN) 300 MG capsule TAKE 1 CAPSULE BY MOUTH TWICE A DAY FOR 30 DAYS 03/22/22   [provider]  sildenafil (VIAGRA) 50 MG tablet Take 50 mg by mouth daily as needed for erectile dysfunction.    [provider]  tiZANidine (ZANAFLEX) 4 MG tablet Take 4 mg by mouth 3 (three) times daily. 05/20/20   [provider]  traZODone (DESYREL) 50 MG tablet Take 50 mg by mouth at bedtime.    [provider]    Family History Family History  Problem Relation Age of Onset   Hypertension Mother    Hypertension Father     Social History Social History   Tobacco Use   Smoking status: Never   Smokeless tobacco: Never  Vaping Use   Vaping status: Never Used  Substance Use Topics   Alcohol use: Yes    Alcohol/week: 14.0 standard drinks of alcohol  Types: 14 Cans of beer per week   Drug use: No     Allergies   Coconut (cocos nucifera) and Sodium hypochlorite   Review of Systems Review of Systems  Constitutional:  Negative for appetite change, fatigue, fever and unexpected weight change.  HENT:  Positive for trouble swallowing. Negative for congestion, rhinorrhea, sore throat and voice change.   Respiratory:  Negative for cough, chest tightness and shortness of breath.   Cardiovascular:  Negative for chest pain.  Gastrointestinal:  Negative for abdominal pain, diarrhea, nausea and vomiting.  Musculoskeletal:  Negative for neck pain.  Neurological:  Negative for weakness, light-headedness and headaches.   Hematological:  Negative for adenopathy.     Physical Exam Triage Vital Signs ED Triage Vitals  Encounter Vitals Group     BP      Systolic BP Percentile      Diastolic BP Percentile      Pulse      Resp      Temp      Temp src      SpO2      Weight      Height      Head Circumference      Peak Flow      Pain Score      Pain Loc      Pain Education      Exclude from Growth Chart    No data found.  Updated Vital Signs BP (!) 126/97 (BP Location: Left Arm)   Pulse 81   Temp 98.1 F (36.7 C) (Oral)   Resp 18   SpO2 96%    Physical Exam Vitals and nursing note reviewed.  Constitutional:      General: He is not in acute distress.    Appearance: Normal appearance. He is well-developed. He is not ill-appearing.  HENT:     Head: Normocephalic and atraumatic.     Nose: Nose normal.     Mouth/Throat:     Mouth: Mucous membranes are moist.     Pharynx: Oropharynx is clear. No posterior oropharyngeal erythema.  Eyes:     General: No scleral icterus.    Conjunctiva/sclera: Conjunctivae normal.  Cardiovascular:     Rate and Rhythm: Normal rate and regular rhythm.     Heart sounds: Normal heart sounds.  Pulmonary:     Effort: Pulmonary effort is normal. No respiratory distress.     Breath sounds: Normal breath sounds. No wheezing, rhonchi or rales.  Abdominal:     Palpations: Abdomen is soft.     Tenderness: There is no abdominal tenderness.  Musculoskeletal:     Cervical back: Neck supple.  Skin:    General: Skin is warm and dry.     Capillary Refill: Capillary refill takes less than 2 seconds.  Neurological:     General: No focal deficit present.     Mental Status: He is alert. Mental status is at baseline.     Motor: No weakness.     Gait: Gait normal.  Psychiatric:        Mood and Affect: Mood normal.        Behavior: Behavior normal.      UC Treatments / Results  Labs (all labs ordered are listed, but only abnormal results are displayed) Labs  Reviewed - No data to display   EKG   Radiology No results found.  Procedures Procedures (including critical care time)  Medications Ordered in UC Medications  alum & mag hydroxide-simeth (MAALOX/MYLANTA)  200-200-20 MG/5ML suspension 30 mL (has no administration in time range)    And  lidocaine (XYLOCAINE) 2 % viscous mouth solution 15 mL (has no administration in time range)    Initial Impression / Assessment and Plan / UC Course  I have reviewed the triage vital signs and the nursing notes.  Pertinent labs & imaging results that were available during my care of the patient were reviewed by me and considered in my medical decision making (see chart for details).   52 year old male presents for sensation of lump in throat and difficulty swallowing for the past 3 to 4 days.  Denies choking on any foods or having difficulty swallowing foods that could have gotten stuck in throat.  Denies fever, cough, congestion, sore throat.  No lymphadenopathy or neck swelling.  Has tried Prilosec without relief.  Vitals are stable.  He is overall well-appearing.  There is no associated swelling of neck, thyroid, lymphadenopathy.  Throat is clear.  Normal swallowing.  Chest clear to auscultation.  Suspect patient's symptoms are related to reflux.  Patient given GI cocktail and advised to continue with Prilosec and also add Pepto-Bismol.  If no improvement over the next week or if symptoms worsen such as he does notice swelling of the neck, face or have difficulty breathing, fever, weight loss, abdominal pain he is to go to the emergency department.  Otherwise advised him to make a follow-up appointment with PCP.   Final Clinical Impressions(s) / UC Diagnoses   Final diagnoses:  Globus pharyngeus  Gastroesophageal reflux disease, unspecified whether esophagitis present     Discharge Instructions      -Discussed, you have something called globus sensation which can be due to many different  things.  Common causes is reflux.  We gave you a GI cocktail in the clinic. -Drink at least 1.5 litres (3 pints) of water every day - drink in small sips as swallowing helps to relax the throat -Avoid too much alcohol, tea, coffee and fizzy drinks -Reduce the amount of fatty and spicy food in your diet -Leave at least 3 hours between your last meal and going to bed -Continue anti-reflux medication. Can also add Pepto Bismol -If you develop pain, throat swelling, or unable to swallow anything, have fever, chest tightness, shortness of breath he should go to the ER.  Otherwise, please make a follow-up appoint with your PCP for further workup if this continues.     ED Prescriptions   None    PDMP not reviewed this encounter.   Shirlee Latch, PA-C 03/18/23 1156

## 2023-03-18 NOTE — ED Triage Notes (Signed)
Pt is here with a swollen esophagus for 4 days now, pt has not taken any meds to relieve discomfort.
